# Patient Record
Sex: Female | Born: 1975 | Race: Black or African American | Hispanic: No | Smoking: Never smoker
Health system: Southern US, Community
[De-identification: ages and names within clinical notes are randomized; demographics above are authoritative.]

## PROBLEM LIST (undated history)

## (undated) DIAGNOSIS — D649 Anemia, unspecified: Secondary | ICD-10-CM

## (undated) DIAGNOSIS — E785 Hyperlipidemia, unspecified: Secondary | ICD-10-CM

## (undated) DIAGNOSIS — I10 Essential (primary) hypertension: Secondary | ICD-10-CM

## (undated) HISTORY — DX: Hyperlipidemia, unspecified: E78.5

## (undated) HISTORY — PX: OTHER SURGICAL HISTORY: SHX169

## (undated) HISTORY — PX: TUBAL LIGATION: SHX77

## (undated) HISTORY — DX: Anemia, unspecified: D64.9

## (undated) HISTORY — PX: LAPAROSCOPIC GASTRIC BANDING: SHX1100

## (undated) HISTORY — PX: BREAST SURGERY: SHX581

---

## 2007-11-08 ENCOUNTER — Ambulatory Visit (HOSPITAL_COMMUNITY): Admission: RE | Admit: 2007-11-08 | Discharge: 2007-11-08 | Payer: Self-pay | Admitting: Surgery

## 2007-11-15 ENCOUNTER — Encounter: Admission: RE | Admit: 2007-11-15 | Discharge: 2007-11-15 | Payer: Self-pay | Admitting: Surgery

## 2007-11-16 ENCOUNTER — Ambulatory Visit (HOSPITAL_COMMUNITY): Admission: RE | Admit: 2007-11-16 | Discharge: 2007-11-16 | Payer: Self-pay | Admitting: Surgery

## 2008-03-19 ENCOUNTER — Encounter: Admission: RE | Admit: 2008-03-19 | Discharge: 2008-05-07 | Payer: Self-pay | Admitting: Surgery

## 2008-04-08 ENCOUNTER — Ambulatory Visit (HOSPITAL_COMMUNITY): Admission: RE | Admit: 2008-04-08 | Discharge: 2008-04-09 | Payer: Self-pay | Admitting: Surgery

## 2010-09-06 ENCOUNTER — Encounter: Payer: Self-pay | Admitting: Surgery

## 2010-12-29 NOTE — Op Note (Signed)
[name], [name] NO.:  [id number]   MEDICAL RECORD NO.:  [id number]          PATIENT TYPE:  AMB   LOCATION:  DAY                          FACILITY:  [facility]   PHYSICIAN:  Sandria Bales. Ezzard Standing, M.D.  DATE OF BIRTH:  Nov 27, 1975   DATE OF PROCEDURE:  04/08/2008  DATE OF DISCHARGE:                               OPERATIVE REPORT   Date of Surgery ??   PREOPERATIVE DIAGNOSIS:  Morbid obesity with a weight of 269, body mass  index of 52.8.   POSTOPERATIVE DIAGNOSIS:  Morbid obesity with a weight of 269, body mass  index of 52.8.   PROCEDURE:  Lap band with an AP standard band.   SURGEON:  Dr. Ezzard Standing.   FIRST ASSISTANT:  Dr. Jaclynn Guarneri.   ANESTHESIA:  General endotracheal.   ESTIMATED BLOOD LOSS:  Minimal.   INDICATION FOR PROCEDURE:  Ms. Bishop is a 35 year old black female who  has been a patient of Dr. Nolon Nations who has been morbidly obese much  of her adult life.  She has been through our preop bariatric program and  now comes for attempted lap band.  The indications and potential  complications of band surgery explained to the patient.  Potential  complications include but not limited to bleeding, infection,  perforation of the bowel, erosion or slippage of the band and long-term  nutritional consequences.   Secondly, she was noted have a mildly elevated creatinine  preoperatively.  This was to 2.1.  This was checked a 2nd time.  Creatinine was 1.87.  I have talked about delaying proceeding with her  case, what the risks and benefits were, and she wanted to proceed with  her surgery.   OPERATIVE NOTE:  The patient placed in the supine position, given a  general endotracheal anesthetic.  Her abdomen was prepped with [facility].  She had a Foley catheter placed, and she was sterilely draped.   She was 1 g of Ancef at the initiation of the procedure.  A time-out was  held with identifying the patient and the procedure.   I accessed the abdominal  cavity through the left upper quadrant with an  11 mm OptiVu trocar.  I explored the abdomen, revealing right and left  lobes of liver unremarkable, gallbladder unremarkable, stomach  unremarkable.  The bowel that I could see was unremarkable.  There was  no other abnormality within the abdominal cavity.   I placed 4 additional trocars, a 5-mm subxiphoid trocar for the liver  retractor, a 15 mm right subcostal trocar, an 11 mm right paramedian  trocar, and an 11 mm left paramedian trocar.   The patient was placed in reverse Trendelenburg position.  A Nathanson  retractor was placed, retracted the left lobe of the liver.  I  identified the gastroesophageal junction and developed the plane along  the angle of his His on the left side.  I then went to the lesser sac  which we took to the gastrohepatic ligament towards the right crus and  passed the finger dissector behind the gastroesophageal junction.   Even though  her BMI is 52, in the upper abdomen she has a less fat than  expected, so I used an AP standard band which I placed into the  abdominal cavity and passed behind the gastroesophageal junction.  At  this time, I passed a sizer down, blew the balloon up to 15 mL and  pulled back, and she did have appear to have a hiatal weakness where the  balloon was drove about half way in but not all the way into the hiatus  on her preop upper GI; however, she did not have any evidence of a  hiatal hernia.  She was not been treated for reflux, so I did not try to  dissect out her hiatus.   I then pulled the balloon all the way around.  I cinched it down over  the sizing balloon.  I did not think the band was too tight.   I then removed the sizing balloon.  I imbricated the cardia, the stomach  over the band using 3 sutures of 2-0 Ethibond suture with the tie knot  cinching down the suture.   After the 3 sutures had been placed, the band lay in good position.  There was no bleeding.  Photos  were taken and placed in the chart.   I then took out the tubing out the right upper quadrant.  The trocars  were then removed.  In turn, there was no bleeding at the trocar site,  and the liver retractor had already been removed.   I then enlarged the incision in the right upper quadrant to about 3-3.5  cm.  I cut down to the abdominal wall fascia.  I attached the reservoir  to the Silastic tubing.  I then placed 2-0 Prolene sutures used for  these to tie the reservoir down.  The reservoir lay flat and appeared to  be in good position.   I then placed 2-0 Vicryl sutures under the subcutaneous tissue.  I  closed the skin with a 5-0 Monocryl suture at each site, and then the  wound was cleaned, prepped, covered with Dermabond.   The patient tolerated the procedure well, was transported to the  recovery room in good condition.  Sponge and needle count correct at the  end of the case.      Sandria Bales. Ezzard Standing, M.D.  Electronically Signed     DHN/MEDQ  D:  04/08/2008  T:  04/08/2008  Job:  [id number]   cc:   Nolon Nations MD

## 2011-10-14 ENCOUNTER — Other Ambulatory Visit (HOSPITAL_COMMUNITY)
Admission: RE | Admit: 2011-10-14 | Discharge: 2011-10-14 | Disposition: A | Discharge status: Home / Self Care | Payer: BC Managed Care – PPO | Source: Ambulatory Visit | Attending: Obstetrics and Gynecology | Admitting: Obstetrics and Gynecology

## 2011-10-14 DIAGNOSIS — Z124 Encounter for screening for malignant neoplasm of cervix: Secondary | ICD-10-CM | POA: Insufficient documentation

## 2011-10-19 ENCOUNTER — Telehealth (INDEPENDENT_AMBULATORY_CARE_PROVIDER_SITE_OTHER): Payer: Self-pay | Admitting: Surgery

## 2011-10-19 NOTE — Telephone Encounter (Signed)
10/19/11 mailed recall letter for bariatric surgery follow-up. Advised the patient to call [facility] @ [phone] to schedule an appointment...cef °

## 2011-11-22 ENCOUNTER — Encounter (HOSPITAL_COMMUNITY): Payer: Self-pay | Admitting: Pharmacist

## 2011-11-27 ENCOUNTER — Encounter (HOSPITAL_COMMUNITY): Payer: Self-pay

## 2011-11-29 ENCOUNTER — Encounter (HOSPITAL_COMMUNITY)
Admission: RE | Admit: 2011-11-29 | Discharge: 2011-11-29 | Disposition: A | Discharge status: Home / Self Care | Payer: BC Managed Care – PPO | Source: Ambulatory Visit | Attending: Obstetrics and Gynecology | Admitting: Obstetrics and Gynecology

## 2011-11-29 ENCOUNTER — Encounter (HOSPITAL_COMMUNITY): Payer: Self-pay

## 2011-11-29 HISTORY — DX: Essential (primary) hypertension: I10

## 2011-11-29 NOTE — Patient Instructions (Signed)
YOUR PROCEDURE IS SCHEDULED ON:12/06/11  ENTER THROUGH THE MAIN ENTRANCE OF [facility] AT:1030  USE DESK PHONE AND DIAL [phone] TO INFORM us OF YOUR ARRIVAL  CALL [phone] IF YOU HAVE ANY QUESTIONS OR PROBLEMS PRIOR TO YOUR ARRIVAL.  REMEMBER: DO NOT AFTER MIDNIGHT : Sunday  SPECIAL INSTRUCTIONS:clear liquids of until 8am Monday   YOU MAY BRUSH YOUR TEETH THE MORNING OF SURGERY   TAKE THESE MEDICINES THE DAY OF SURGERY WITH SIP OF WATER: BP med  DO NOT WEAR JEWELRY, EYE MAKEUP, LIPSTICK OR DARK FINGERNAIL POLISH DO NOT WEAR LOTIONS DO NOT SHAVE FOR 48 HOURS PRIOR TO SURGERY  YOU WILL NOT BE ALLOWED TO DRIVE YOURSELF HOME.  NAME OF DRIVER:APril sister- [phone]

## 2011-11-30 LAB — CBC
Hemoglobin: 11.1 g/dL — ABNORMAL LOW (ref 12.0–15.0)
MCHC: 31.8 g/dL (ref 30.0–36.0)
Platelets: 431 10*3/uL — ABNORMAL HIGH (ref 150–400)
RDW: 16.5 % — ABNORMAL HIGH (ref 11.5–15.5)

## 2011-12-02 NOTE — Pre-Procedure Instructions (Signed)
Message left on answering machine of time change to 9AM, to arrive here at 7:30. Number given if questions.[phone]).  Patton Salles, RN

## 2011-12-06 ENCOUNTER — Encounter (HOSPITAL_COMMUNITY)
Admission: RE | Disposition: A | Discharge status: Home / Self Care | Payer: Self-pay | Source: Ambulatory Visit | Attending: Obstetrics and Gynecology

## 2011-12-06 ENCOUNTER — Ambulatory Visit (HOSPITAL_COMMUNITY): Payer: BC Managed Care – PPO | Admitting: Anesthesiology

## 2011-12-06 ENCOUNTER — Encounter (HOSPITAL_COMMUNITY): Payer: Self-pay | Admitting: Anesthesiology

## 2011-12-06 ENCOUNTER — Ambulatory Visit (HOSPITAL_COMMUNITY)
Admission: RE | Admit: 2011-12-06 | Discharge: 2011-12-06 | Disposition: A | Discharge status: Home / Self Care | Payer: BC Managed Care – PPO | Source: Ambulatory Visit | Attending: Obstetrics and Gynecology | Admitting: Obstetrics and Gynecology

## 2011-12-06 ENCOUNTER — Other Ambulatory Visit (HOSPITAL_COMMUNITY): Payer: Self-pay | Admitting: Obstetrics and Gynecology

## 2011-12-06 DIAGNOSIS — Z01818 Encounter for other preprocedural examination: Secondary | ICD-10-CM | POA: Insufficient documentation

## 2011-12-06 DIAGNOSIS — N840 Polyp of corpus uteri: Secondary | ICD-10-CM | POA: Insufficient documentation

## 2011-12-06 DIAGNOSIS — Z01812 Encounter for preprocedural laboratory examination: Secondary | ICD-10-CM | POA: Insufficient documentation

## 2011-12-06 DIAGNOSIS — Z9889 Other specified postprocedural states: Secondary | ICD-10-CM

## 2011-12-06 DIAGNOSIS — N920 Excessive and frequent menstruation with regular cycle: Secondary | ICD-10-CM | POA: Insufficient documentation

## 2011-12-06 LAB — BASIC METABOLIC PANEL
GFR calc Af Amer: >90 mL/min (ref >90)
GFR calc non Af Amer: >90 mL/min (ref >90)
Potassium: 3.4 mEq/L — ABNORMAL LOW (ref 3.5–5.1)
Sodium: 137 mEq/L (ref 135–145)

## 2011-12-06 SURGERY — DILATATION & CURETTAGE/HYSTEROSCOPY WITH THERMACHOICE ABLATION
Anesthesia: General | Site: Vagina | Wound class: Clean Contaminated

## 2011-12-06 MED ORDER — PROPOFOL 10 MG/ML IV EMUL
INTRAVENOUS | Status: DC | PRN
  Administered 2011-12-06: 200 mg via INTRAVENOUS

## 2011-12-06 MED ORDER — KETOROLAC TROMETHAMINE 30 MG/ML IJ SOLN
INTRAMUSCULAR | Status: DC | PRN
  Administered 2011-12-06: 30 mg via INTRAVENOUS

## 2011-12-06 MED ORDER — PROPOFOL 10 MG/ML IV EMUL
INTRAVENOUS | Status: AC
  Filled 2011-12-06: qty 20

## 2011-12-06 MED ORDER — OXYCODONE-ACETAMINOPHEN 5-325 MG PO TABS
1.0000 | ORAL_TABLET | Freq: Once | ORAL | Status: AC
  Administered 2011-12-06: 1 via ORAL

## 2011-12-06 MED ORDER — OXYCODONE-ACETAMINOPHEN 5-325 MG PO TABS
ORAL_TABLET | ORAL | Status: AC
  Filled 2011-12-06: qty 1

## 2011-12-06 MED ORDER — FENTANYL CITRATE 0.05 MG/ML IJ SOLN
INTRAMUSCULAR | Status: DC | PRN
  Administered 2011-12-06: 100 ug via INTRAVENOUS

## 2011-12-06 MED ORDER — ONDANSETRON HCL 4 MG/2ML IJ SOLN
INTRAMUSCULAR | Status: AC
  Filled 2011-12-06: qty 2

## 2011-12-06 MED ORDER — DEXTROSE 5 % IV SOLN
INTRAVENOUS | Status: DC | PRN
  Administered 2011-12-06: 1000 mL via INTRAVENOUS

## 2011-12-06 MED ORDER — FENTANYL CITRATE 0.05 MG/ML IJ SOLN
INTRAMUSCULAR | Status: AC
  Filled 2011-12-06: qty 2

## 2011-12-06 MED ORDER — METOCLOPRAMIDE HCL 5 MG/ML IJ SOLN: 10.0000 mg | Freq: Once | INTRAMUSCULAR | Status: DC | PRN

## 2011-12-06 MED ORDER — FENTANYL CITRATE 0.05 MG/ML IJ SOLN
25.0000 ug | INTRAMUSCULAR | Status: DC | PRN
  Administered 2011-12-06: 50 ug via INTRAVENOUS
  Administered 2011-12-06: 25 ug via INTRAVENOUS
  Administered 2011-12-06: 50 ug via INTRAVENOUS
  Administered 2011-12-06: 25 ug via INTRAVENOUS

## 2011-12-06 MED ORDER — KETOROLAC TROMETHAMINE 30 MG/ML IJ SOLN
INTRAMUSCULAR | Status: AC
  Filled 2011-12-06: qty 1

## 2011-12-06 MED ORDER — PROMETHAZINE HCL 25 MG RE SUPP
RECTAL | Status: AC
  Administered 2011-12-06: 25 mg via RECTAL
  Filled 2011-12-06: qty 1

## 2011-12-06 MED ORDER — FENTANYL CITRATE 0.05 MG/ML IJ SOLN
INTRAMUSCULAR | Status: AC
Start: 2011-12-06 — End: 2011-12-06
  Administered 2011-12-06: 50 ug via INTRAVENOUS
  Filled 2011-12-06: qty 2

## 2011-12-06 MED ORDER — DEXAMETHASONE SODIUM PHOSPHATE 10 MG/ML IJ SOLN
INTRAMUSCULAR | Status: AC
  Filled 2011-12-06: qty 1

## 2011-12-06 MED ORDER — DEXAMETHASONE SODIUM PHOSPHATE 10 MG/ML IJ SOLN
INTRAMUSCULAR | Status: DC | PRN
  Administered 2011-12-06: 10 mg via INTRAVENOUS

## 2011-12-06 MED ORDER — MIDAZOLAM HCL 5 MG/5ML IJ SOLN
INTRAMUSCULAR | Status: DC | PRN
  Administered 2011-12-06: 2 mg via INTRAVENOUS

## 2011-12-06 MED ORDER — LIDOCAINE HCL (CARDIAC) 20 MG/ML IV SOLN
INTRAVENOUS | Status: DC | PRN
  Administered 2011-12-06: 80 mg via INTRAVENOUS

## 2011-12-06 MED ORDER — LACTATED RINGERS IV SOLN
INTRAVENOUS | Status: DC
  Administered 2011-12-06 (×2): via INTRAVENOUS

## 2011-12-06 MED ORDER — PROMETHAZINE HCL 25 MG RE SUPP
25.0000 mg | Freq: Once | RECTAL | Status: AC
  Administered 2011-12-06: 25 mg via RECTAL

## 2011-12-06 MED ORDER — LIDOCAINE HCL (CARDIAC) 20 MG/ML IV SOLN
INTRAVENOUS | Status: AC
  Filled 2011-12-06: qty 5

## 2011-12-06 MED ORDER — 0.9 % SODIUM CHLORIDE (POUR BTL) OPTIME
TOPICAL | Status: DC | PRN
  Administered 2011-12-06: 1000 mL

## 2011-12-06 MED ORDER — MIDAZOLAM HCL 2 MG/2ML IJ SOLN
INTRAMUSCULAR | Status: AC
  Filled 2011-12-06: qty 2

## 2011-12-06 MED ORDER — ONDANSETRON HCL 4 MG/2ML IJ SOLN
INTRAMUSCULAR | Status: DC | PRN
  Administered 2011-12-06: 4 mg via INTRAVENOUS

## 2011-12-06 MED ORDER — MEPERIDINE HCL 25 MG/ML IJ SOLN: 6.2500 mg | INTRAMUSCULAR | Status: DC | PRN

## 2011-12-06 SURGICAL SUPPLY — 15 items
CANISTER SUCTION 2500CC (MISCELLANEOUS) ×2 IMPLANT
CATH ROBINSON RED A/P 16FR (CATHETERS) ×2 IMPLANT
CATH THERMACHOICE III (CATHETERS) ×2 IMPLANT
CLOTH BEACON ORANGE TIMEOUT ST (SAFETY) ×2 IMPLANT
CONTAINER PREFILL 10% NBF 60ML (FORM) ×4 IMPLANT
DILATOR CANAL MILEX (MISCELLANEOUS) IMPLANT
GLOVE BIO SURGEON STRL SZ7 (GLOVE) ×2 IMPLANT
GLOVE BIOGEL PI IND STRL 7.0 (GLOVE) ×2 IMPLANT
GLOVE BIOGEL PI INDICATOR 7.0 (GLOVE) ×2
GOWN PREVENTION PLUS LG XLONG (DISPOSABLE) ×2 IMPLANT
GOWN PREVENTION PLUS XLARGE (GOWN DISPOSABLE) ×2 IMPLANT
GOWN STRL REIN XL XLG (GOWN DISPOSABLE) ×2 IMPLANT
PACK HYSTEROSCOPY LF (CUSTOM PROCEDURE TRAY) ×2 IMPLANT
TOWEL OR 17X24 6PK STRL BLUE (TOWEL DISPOSABLE) ×4 IMPLANT
WATER STERILE IRR 1000ML POUR (IV SOLUTION) ×2 IMPLANT

## 2011-12-06 NOTE — Anesthesia Preprocedure Evaluation (Signed)
Anesthesia Evaluation  Patient identified by MRN, date of birth, ID band Patient awake    Reviewed: Allergy & Precautions, H&P , NPO status , Patient's Chart, lab work & pertinent test results  Airway Mallampati: II TM Distance: >3 FB Neck ROM: Full    Dental No notable dental hx. (+) Teeth Intact   Pulmonary neg pulmonary ROS,  breath sounds clear to auscultation  Pulmonary exam normal       Cardiovascular hypertension, Pt. on medications Rhythm:Regular Rate:Normal     Neuro/Psych negative neurological ROS  negative psych ROS   GI/Hepatic negative GI ROS, Neg liver ROS,   Endo/Other  Morbid obesity  Renal/GU negative Renal ROS  negative genitourinary   Musculoskeletal negative musculoskeletal ROS (+)   Abdominal Normal abdominal exam  (+) + obese,   Peds  Hematology negative hematology ROS (+)   Anesthesia Other Findings   Reproductive/Obstetrics negative OB ROS                           Anesthesia Physical Anesthesia Plan  ASA: III  Anesthesia Plan: General   Post-op Pain Management:    Induction: Intravenous  Airway Management Planned: LMA  Additional Equipment:   Intra-op Plan:   Post-operative Plan: Extubation in OR  Informed Consent: I have reviewed the patients History and Physical, chart, labs and discussed the procedure including the risks, benefits and alternatives for the proposed anesthesia with the patient or authorized representative who has indicated his/her understanding and acceptance.   Dental advisory given  Plan Discussed with: CRNA, Anesthesiologist and Surgeon  Anesthesia Plan Comments:         Anesthesia Quick Evaluation

## 2011-12-06 NOTE — Discharge Instructions (Signed)

## 2011-12-06 NOTE — Transfer of Care (Signed)
Immediate Anesthesia Transfer of Care Note  Patient: [name]  Procedure(s) Performed: Procedure(s) (LRB): DILATATION & CURETTAGE/HYSTEROSCOPY WITH THERMACHOICE ABLATION (N/A)  Patient Location: PACU  Anesthesia Type: General  Level of Consciousness: sedated  Airway & Oxygen Therapy: Patient Spontanous Breathing and Patient connected to nasal cannula oxygen  Post-op Assessment: Report given to PACU RN and Post -op Vital signs reviewed and stable  Post vital signs: stable  Complications: No apparent anesthesia complications

## 2011-12-06 NOTE — Anesthesia Postprocedure Evaluation (Signed)
  Anesthesia Post-op Note  Patient: [name]  Procedure(s) Performed: Procedure(s) (LRB): DILATATION & CURETTAGE/HYSTEROSCOPY WITH THERMACHOICE ABLATION (N/A)  Patient Location: PACU  Anesthesia Type: General  Level of Consciousness: awake, alert  and oriented  Airway and Oxygen Therapy: Patient Spontanous Breathing  Post-op Pain: none  Post-op Assessment: Post-op Vital signs reviewed, Patient's Cardiovascular Status Stable, Respiratory Function Stable, Patent Airway, No signs of Nausea or vomiting and Pain level controlled  Post-op Vital Signs: Reviewed and stable  Complications: No apparent anesthesia complications

## 2011-12-06 NOTE — H&P (Signed)
The patient is a 36 yo G3 P3 s/p tubal with menorrhagia.  Ablation was attempted in the office 3 weeks ago and stopped due to discomfort.  The procedure in now scheduled in out patient surgery.risks and benefits of the procedure, as well as possible complications have been explained.  Past medical history is noncontributory.  No known allergy  Physical exam Vital signs are stable HEENT exam is Chest is clear Heart S1 and S2 is clear Abdomen normal.   Pelvic exam is deferred today  Assessment menorrhagia Plan ThermaChoice ablation

## 2011-12-07 NOTE — Op Note (Signed)
12/06/2011  Procedure time 9:30 AM    PATIENT:  [name]  36 y.o. female gravida 6, para 3, A3, status post tubal ligation, who worsening menorrhagia, RN, Mindi Junker a the patient underwent a hysteroscopy, D&C, and attempted ThermaChoice ablation in the office.  This had to be discontinued due to severe cramping and discomfort.  And we are rescheduling the procedure today in outpatient clinic or we can give no anesthesia. The risks, and possible complications of the procedure has been explained and informed consent   PRE-OPERATIVE DIAGNOSIS: menorrhagia  POST-OPERATIVE DIAGNOSIS: same  PROCEDURE: ThermaChoice ablation and polypectomy   SURGEON: Dr. Arlyce Harman    PHYSICIAN ASSISTANT: none  ASSISTANTS: none   ANESTHESIA:  Gen.  EBL: minimal  BLOOD ADMINISTERED:  None  DRAINS: 9   LOCAL MEDICATIONS USED: none  SPECIMEN: Uterine polyp  DISPOSITION OF SPECIMEN:  Sent to pathology  COUNTS:  correct   DICTATION:  Detail of procedure  The patient was placed on the table in a supine position.  General anesthesia was induced.  She was then placed in a dorsal lithotomy position.  The vagina was sterilely prepped and draped in usual fashion.  A red Robinson catheter was used to drain the bladder.  An exam under anesthesia revealed an approximately 8 week size uterus that was  anteflexed.  The uterus sounded to 8 cm.  The procedure was initiated by placing 15 cc of D5W into the balloon to check for compliance.  There were no leaks.  Then, the fluid was aspirated out as the trumpet valve was tapped until a pressure reached -150.  The syringe was filled up witho 30 cc of D5W.  The syringe was reattached to the catheter and the catheter was placed inside the uterus.  And and the balloon was slowly filled until we got a pressure of 180, carefully tapping off as needed to keep the pressure under 200.  After the pressure had stabilized, we pressed to start. The temperature rose up to 87,  and then the machine started the therapy cycle.  After 8 minutes the therapy cycle was completed. The cool down was allowed and the machine beeped signifying its completion.  We aspirated the fluid out of the balloon and removed the catheter.  At this poin, all instruments were removed and the patient was awakened and taken to recovery in good condition.    PLAN OF CARE: discharge home  PATIENT DISPOSITION: patient sent to recovery in good condition

## 2011-12-07 NOTE — H&P (Signed)
         Patient Information       Patient Name  Sex  DOB  SSN    Saraiya, Kozma  Female  May 12, 1976  [id number]             H&P signed by Fortino Sic, MD at 12/06/11 [id number]     Author:  Fortino Sic, MD  Service:  Gynecology  Author Type:  Physician   Filed:  12/06/11 0913  Note Time:  12/06/11 0907          The patient is a 36 yo G6 P3A3 s/p tubal with menorrhagia. Ablation was attempted in the office approximately six  weeks ago and stopped due to discomfort. The procedure in now scheduled in out patient surgery. Risks and benefits of the procedure, as well as possible complications have been explained. Informed consent has been given.  Past medical history Hypertension Hypercholesterolemia   No known allergy   Medication Lipitor, hydrochlorothiazide  Family history diabetes in maternal grandmother and on an uncle Hypertension.  Grandmother and an uncle  Review of systems is noncontributory   Physical exam  Vital signs are stable  HEENT exam is  Chest is clear  Heart S1 and S2 is clear  Abdomen normal, without masses or tenderness  Pelvic exam is deferred today    Assessment:   menorrhagia   Plan:   ThermaChoice ablation

## 2012-10-26 ENCOUNTER — Ambulatory Visit (INDEPENDENT_AMBULATORY_CARE_PROVIDER_SITE_OTHER): Payer: BC Managed Care – PPO | Admitting: Emergency Medicine

## 2012-10-26 VITALS — BP 122/70 | HR 58 | Temp 98.5°F | Resp 16 | Ht 60.5 in | Wt 223.0 lb

## 2012-10-26 DIAGNOSIS — E782 Mixed hyperlipidemia: Secondary | ICD-10-CM

## 2012-10-26 MED ORDER — HYDROCHLOROTHIAZIDE 12.5 MG PO CAPS: 12.5000 mg | ORAL_CAPSULE | Freq: Every day | ORAL | Status: DC

## 2012-10-26 MED ORDER — ATORVASTATIN CALCIUM 40 MG PO TABS: 40.0000 mg | ORAL_TABLET | Freq: Every day | ORAL | Status: DC

## 2012-10-26 NOTE — Progress Notes (Signed)
[facility] [address] [place] [address] [phone]  Date:  10/26/2012   Name:  [name]   DOB:  09/06/1975   MRN:  [id number]  PCP:  Garth Schlatter, MD    Chief Complaint: Edema and bloodwork   History of Present Illness:  [name] is a 37 y.o. very pleasant female patient who presents with the following:  History of peripheral edema treated in the past with HCTZ.  Has been off the medication 2 weeks and requests a refill.  Also is out of her cholesterol medication.  No shortness of breath, wheezing, cough or chest pain.  Denies other complaint or health concern today.   There is no problem list on file for this patient.   Past Medical History  Diagnosis Date  . Hypertension   . Hyperlipidemia   . Anemia     Past Surgical History  Procedure Laterality Date  . Tubal ligation    . Breast surgery    . Laparoscopic gastric banding    . Oblation      History  Substance Use Topics  . Smoking status: Never Smoker   . Smokeless tobacco: Not on file  . Alcohol Use: No     Comment: once in a blue moon    Family History  Problem Relation Age of Onset  . Hypertension Maternal Grandmother   . Diabetes Maternal Grandmother   . Diabetes Maternal Grandfather   . Hypertension Maternal Grandfather     No Known Allergies  Medication list has been reviewed and updated.  Current Outpatient Prescriptions on File Prior to Visit  Medication Sig Dispense Refill  . atorvastatin (LIPITOR) 40 MG tablet Take 40 mg by mouth daily.      . hydrochlorothiazide (MICROZIDE) 12.5 MG capsule Take 12.5 mg by mouth daily.      Marland Kitchen ibuprofen (ADVIL,MOTRIN) 800 MG tablet Take 800 mg by mouth daily as needed. pain      . oxyCODONE-acetaminophen (PERCOCET) 5-325 MG per tablet Take 1 tablet by mouth every 4 (four) hours as needed. For pain (after procedure)       No current facility-administered medications on file prior to visit.    Review of  Systems:  As per HPI, otherwise negative.    Physical Examination: Filed Vitals:   10/26/12 2043  BP: 122/70  Pulse: 58  Temp: 98.5 F (36.9 C)  Resp: 16   Filed Vitals:   10/26/12 2043  Height: 5' 0.5" (1.537 m)  Weight: 223 lb (101.152 kg)   Body mass index is 42.82 kg/(m^2). Ideal Body Weight: Weight in (lb) to have BMI = 25: 129.9  GEN: WDWN, NAD, Non-toxic, A & O x 3 HEENT: Atraumatic, Normocephalic. Neck supple. No masses, No LAD. Ears and Nose: No external deformity. CV: RRR, No M/G/R. No JVD. No thrill. No extra heart sounds. PULM: CTA B, no wheezes, crackles, rhonchi. No retractions. No resp. distress. No accessory muscle use. ABD: S, NT, ND, +BS. No rebound. No HSM. EXTR: No c/c  Trace peripheral edema ankles NEURO Normal gait.  PSYCH: Normally interactive. Conversant. Not depressed or anxious appearing.  Calm demeanor.    Assessment and Plan: Peripheral edema Hyperlipidemia Refill meds Follow up in one month  Carmelina Dane, MD

## 2012-10-26 NOTE — Addendum Note (Signed)
Addended by: Carmelina Dane on: 10/26/2012 09:12 PM   Modules accepted: Orders

## 2012-10-26 NOTE — Patient Instructions (Addendum)

## 2012-11-02 ENCOUNTER — Ambulatory Visit
Admission: RE | Admit: 2012-11-02 | Discharge: 2012-11-02 | Disposition: A | Discharge status: Home / Self Care | Payer: BC Managed Care – PPO | Source: Ambulatory Visit | Attending: Physician Assistant | Admitting: Physician Assistant

## 2012-11-02 ENCOUNTER — Encounter (INDEPENDENT_AMBULATORY_CARE_PROVIDER_SITE_OTHER): Payer: Self-pay

## 2012-11-02 ENCOUNTER — Ambulatory Visit (INDEPENDENT_AMBULATORY_CARE_PROVIDER_SITE_OTHER): Payer: BC Managed Care – PPO | Admitting: Physician Assistant

## 2012-11-02 VITALS — BP 118/68 | HR 70 | Temp 96.8°F | Resp 18 | Ht 59.0 in | Wt 214.6 lb

## 2012-11-02 DIAGNOSIS — Z4651 Encounter for fitting and adjustment of gastric lap band: Secondary | ICD-10-CM

## 2012-11-02 NOTE — Patient Instructions (Signed)
Return in two weeks. Focus on good food choices as well as physical activity. Return sooner if you have an increase in hunger, portion sizes or weight. Return also for difficulty swallowing, night cough, reflux.   

## 2012-11-02 NOTE — Progress Notes (Signed)
  HISTORY: [name] is a 37 y.o.female who received an AP-Standard lap-band in August 2009 by Dr. Ezzard Standing. She comes in with 43 lbs weight loss since her last visit in January 2012 but unfortunately she's had three months of progressive dysphagia, to the point where she cannot tolerate water well. She would like fluid removed.  VITAL SIGNS: Filed Vitals:   11/02/12 1058  BP: 118/68  Pulse: 70  Temp: 96.8 F (36 C)  Resp: 18    PHYSICAL EXAM: Physical exam reveals a very well-appearing 37 y.o.female in no apparent distress Neurologic: Awake, alert, oriented Psych: Bright affect, conversant Respiratory: Breathing even and unlabored. No stridor or wheezing Abdomen: Soft, nontender, nondistended to palpation. Incisions well-healed. No incisional hernias. Port easily palpated. Extremities: Atraumatic, good range of motion.  ASSESMENT: 37 y.o.  female  s/p AP-Standard lap-band.   PLAN: The patient's port was accessed with a 20G Huber needle without difficulty. Clear fluid was aspirated and 6.5 mL saline was removed from the port to give a total predicted volume of 0 mL. The patient was able to swallow water without difficulty and with significantly greater ease following the procedure and was instructed to take clear liquids for the next 24-48 hours and advance slowly as tolerated. I've ordered a KUB to rule out slip. We'll have her back in two weeks.

## 2012-11-06 ENCOUNTER — Telehealth (INDEPENDENT_AMBULATORY_CARE_PROVIDER_SITE_OTHER): Payer: Self-pay | Admitting: *Deleted

## 2012-11-06 NOTE — Telephone Encounter (Signed)
Patient called to ask about XR results and to state she is having acid reflux.  Patient instructed to use an OTC medication Zantac or Prilosec to help with the GERD.  Andrey Campanile MD reviewed XR and stated Ezzard Standing MD may want to see patient for follow-up but didn't see anything urgent.  Patient has an appt with Mardelle Matte PA on 11/16/12

## 2012-11-07 ENCOUNTER — Telehealth (INDEPENDENT_AMBULATORY_CARE_PROVIDER_SITE_OTHER): Payer: Self-pay

## 2012-11-07 NOTE — Telephone Encounter (Signed)
Patient states the Tums has relieved her reflux I advised her not to eat 2hrs prior to going to bed. Avoid spicy , greasy,foods. Patient verbalized understanding .

## 2012-11-16 ENCOUNTER — Ambulatory Visit (INDEPENDENT_AMBULATORY_CARE_PROVIDER_SITE_OTHER): Payer: BC Managed Care – PPO | Admitting: Physician Assistant

## 2012-11-16 ENCOUNTER — Encounter (INDEPENDENT_AMBULATORY_CARE_PROVIDER_SITE_OTHER): Payer: Self-pay

## 2012-11-16 VITALS — BP 136/76 | HR 86 | Temp 97.6°F | Resp 18 | Ht 60.0 in | Wt 219.0 lb

## 2012-11-16 DIAGNOSIS — Z4651 Encounter for fitting and adjustment of gastric lap band: Secondary | ICD-10-CM

## 2012-11-16 NOTE — Progress Notes (Signed)
  HISTORY: [name] is a 37 y.o.female who received an AP-Standard lap-band in August 2009 by Dr. Ezzard Standing. She comes in after having all fluid removed for obstruction on 11/02/12. She had a couple of days of epigastric burning sensation following fluid removal, but this has completely resolved. She is now experiencing hunger and large portion sizes as well as 4 lbs weight gain. She would like a fill. She is going on a cruise toward the end of the month.  VITAL SIGNS: Filed Vitals:   11/16/12 1136  BP: 136/76  Pulse: 86  Temp: 97.6 F (36.4 C)  Resp: 18    PHYSICAL EXAM: Physical exam reveals a very well-appearing 37 y.o.female in no apparent distress Neurologic: Awake, alert, oriented Psych: Bright affect, conversant Respiratory: Breathing even and unlabored. No stridor or wheezing Abdomen: Soft, nontender, nondistended to palpation. Incisions well-healed. No incisional hernias. Port easily palpated. Extremities: Atraumatic, good range of motion.  ASSESMENT: 37 y.o.  female  s/p AP-Standard lap-band.   PLAN: The patient's port was accessed with a 20G Huber needle without difficulty. Clear fluid was aspirated and 4.5 mL saline was added to the port to give a total predicted volume of 4.5 mL. The patient was able to swallow water without difficulty following the procedure and was instructed to take clear liquids for the next 24-48 hours and advance slowly as tolerated. Since [place] is going out of the country, I wanted to minimize the risk of obstruction while gone. I'd imagine her target fill volume between 5.5-6 mL. I discussed this with the patient who voiced understanding and agreement.

## 2012-11-16 NOTE — Patient Instructions (Signed)
Take clear liquids tonight. Thin protein shakes are ok to start tomorrow morning. Slowly advance your diet thereafter. Call us if you have persistent vomiting or regurgitation, night cough or reflux symptoms. Return as scheduled or sooner if you notice no changes in hunger/portion sizes.  

## 2012-12-28 ENCOUNTER — Ambulatory Visit (INDEPENDENT_AMBULATORY_CARE_PROVIDER_SITE_OTHER): Payer: BC Managed Care – PPO | Admitting: Physician Assistant

## 2012-12-28 ENCOUNTER — Encounter (INDEPENDENT_AMBULATORY_CARE_PROVIDER_SITE_OTHER): Payer: Self-pay

## 2012-12-28 VITALS — BP 120/82 | HR 76 | Temp 97.0°F | Resp 18 | Ht 59.0 in | Wt 230.8 lb

## 2012-12-28 DIAGNOSIS — Z4651 Encounter for fitting and adjustment of gastric lap band: Secondary | ICD-10-CM

## 2012-12-28 NOTE — Patient Instructions (Signed)
Take clear liquids tonight. Thin protein shakes are ok to start tomorrow morning. Slowly advance your diet thereafter. Call us if you have persistent vomiting or regurgitation, night cough or reflux symptoms. Return as scheduled or sooner if you notice no changes in hunger/portion sizes.  

## 2012-12-28 NOTE — Progress Notes (Signed)
  HISTORY: [name] is a 37 y.o.female who received an AP-Standard lap-band in August 2009 by Dr. Ezzard Standing. She comes in with 12 lbs weight gain since going on a cruise. She has no complaints of regurgitation or reflux. We had replace 4.5 mL fluid in her band in April from a previous lap band holiday secondary to obstruction. She has significant hunger and larger than desired portion sizes.  VITAL SIGNS: Filed Vitals:   12/28/12 1015  BP: 120/82  Pulse: 76  Temp: 97 F (36.1 C)  Resp: 18    PHYSICAL EXAM: Physical exam reveals a very well-appearing 37 y.o.female in no apparent distress Neurologic: Awake, alert, oriented Psych: Bright affect, conversant Respiratory: Breathing even and unlabored. No stridor or wheezing Abdomen: Soft, nontender, nondistended to palpation. Incisions well-healed. No incisional hernias. Port easily palpated. Extremities: Atraumatic, good range of motion.  ASSESMENT: 37 y.o.  female  s/p AP-Standard lap-band.   PLAN: The patient's port was accessed with a 20G Huber needle without difficulty. Clear fluid was aspirated and 1 mL saline was added to the port to give a total predicted volume of 5.5 mL. The patient was able to swallow water without difficulty following the procedure and was instructed to take clear liquids for the next 24-48 hours and advance slowly as tolerated.

## 2012-12-29 ENCOUNTER — Other Ambulatory Visit (INDEPENDENT_AMBULATORY_CARE_PROVIDER_SITE_OTHER): Payer: BC Managed Care – PPO | Admitting: Emergency Medicine

## 2012-12-29 VITALS — HR 72 | Temp 98.0°F | Resp 18

## 2012-12-29 DIAGNOSIS — E782 Mixed hyperlipidemia: Secondary | ICD-10-CM

## 2012-12-29 DIAGNOSIS — I10 Essential (primary) hypertension: Secondary | ICD-10-CM

## 2012-12-29 LAB — COMPREHENSIVE METABOLIC PANEL
ALT: 17 U/L (ref 0–35)
CO2: 26 mEq/L (ref 19–32)
Calcium: 9.3 mg/dL (ref 8.4–10.5)
Chloride: 103 mEq/L (ref 96–112)
Creat: 0.84 mg/dL (ref 0.50–1.10)
Glucose, Bld: 88 mg/dL (ref 70–99)

## 2012-12-29 LAB — LIPID PANEL
Cholesterol: 157 mg/dL (ref 0–200)
Total CHOL/HDL Ratio: 3 Ratio

## 2012-12-30 LAB — VITAMIN D 25 HYDROXY (VIT D DEFICIENCY, FRACTURES): Vit D, 25-Hydroxy: 19 ng/mL — ABNORMAL LOW (ref 30–89)

## 2012-12-30 MED ORDER — VITAMIN D (ERGOCALCIFEROL) 1.25 MG (50000 UNIT) PO CAPS: 50000.0000 [IU] | ORAL_CAPSULE | ORAL | Status: DC

## 2012-12-30 NOTE — Addendum Note (Signed)
Addended by: Carmelina Dane on: 12/30/2012 12:45 PM   Modules accepted: Orders

## 2013-01-01 ENCOUNTER — Other Ambulatory Visit: Payer: Self-pay | Admitting: Emergency Medicine

## 2013-01-01 NOTE — Telephone Encounter (Signed)
Due for OV, labs 

## 2013-01-10 ENCOUNTER — Telehealth (HOSPITAL_COMMUNITY): Payer: Self-pay | Admitting: *Deleted

## 2013-01-10 NOTE — Telephone Encounter (Signed)
Telephoned pt at mobile # and left message to return call to [facility]

## 2013-01-29 ENCOUNTER — Other Ambulatory Visit: Payer: Self-pay | Admitting: Physician Assistant

## 2013-01-30 NOTE — Telephone Encounter (Signed)
Was due for f/u April 2014

## 2013-02-01 ENCOUNTER — Encounter (INDEPENDENT_AMBULATORY_CARE_PROVIDER_SITE_OTHER): Payer: BC Managed Care – PPO

## 2013-03-05 ENCOUNTER — Other Ambulatory Visit: Payer: Self-pay | Admitting: Physician Assistant

## 2013-03-08 ENCOUNTER — Other Ambulatory Visit: Payer: Self-pay | Admitting: Physician Assistant

## 2013-03-15 ENCOUNTER — Encounter (INDEPENDENT_AMBULATORY_CARE_PROVIDER_SITE_OTHER): Payer: BC Managed Care – PPO

## 2013-04-06 ENCOUNTER — Other Ambulatory Visit: Payer: Self-pay | Admitting: Physician Assistant

## 2013-04-06 NOTE — Telephone Encounter (Signed)
Needs OV, labs 

## 2013-04-10 ENCOUNTER — Telehealth: Payer: Self-pay

## 2013-04-10 NOTE — Telephone Encounter (Signed)
done

## 2013-04-10 NOTE — Telephone Encounter (Signed)
Patient is requesting a medical release form be faxed to her at [phone]

## 2013-04-12 ENCOUNTER — Telehealth (INDEPENDENT_AMBULATORY_CARE_PROVIDER_SITE_OTHER): Payer: Self-pay

## 2013-04-12 ENCOUNTER — Encounter (INDEPENDENT_AMBULATORY_CARE_PROVIDER_SITE_OTHER): Payer: BC Managed Care – PPO

## 2013-04-12 NOTE — Telephone Encounter (Signed)
Left a message for the patient to give [place] a call to reschedule her missed appointment.

## 2013-04-25 ENCOUNTER — Encounter (INDEPENDENT_AMBULATORY_CARE_PROVIDER_SITE_OTHER): Payer: Self-pay | Admitting: Physician Assistant

## 2013-06-14 ENCOUNTER — Other Ambulatory Visit: Payer: Self-pay | Admitting: Physician Assistant

## 2013-06-21 ENCOUNTER — Encounter (INDEPENDENT_AMBULATORY_CARE_PROVIDER_SITE_OTHER): Payer: Self-pay

## 2013-06-21 ENCOUNTER — Ambulatory Visit (INDEPENDENT_AMBULATORY_CARE_PROVIDER_SITE_OTHER): Payer: BC Managed Care – PPO | Admitting: Physician Assistant

## 2013-06-21 VITALS — BP 124/84 | HR 84 | Temp 98.6°F | Resp 15 | Ht 60.0 in | Wt 268.0 lb

## 2013-06-21 DIAGNOSIS — Z4651 Encounter for fitting and adjustment of gastric lap band: Secondary | ICD-10-CM

## 2013-06-21 NOTE — Progress Notes (Signed)
  HISTORY: [name] is a 37 y.o.female who received an AP-Standard lap-band in August 2009 by Dr. Ezzard Standing. She comes in with 37 lbs weight gain since her last visit in May. She is basically back up to her pre-op weight. She denies regurgitation but she basically can eat anything. She was unable to come to clinic to get fills in the meantime. She has not had much exercise recently due to the change in weather. She is now able to make her appointments so she wants to get back on track.  VITAL SIGNS: Filed Vitals:   06/21/13 1512  BP: 124/84  Pulse: 84  Temp: 98.6 F (37 C)  Resp: 15    PHYSICAL EXAM: Physical exam reveals a very well-appearing 37 y.o.female in no apparent distress Neurologic: Awake, alert, oriented Psych: Bright affect, conversant Respiratory: Breathing even and unlabored. No stridor or wheezing Abdomen: Soft, nontender, nondistended to palpation. Incisions well-healed. No incisional hernias. Port easily palpated. Extremities: Atraumatic, good range of motion.  ASSESMENT: 37 y.o.  female  s/p AP-Standard lap-band.   PLAN: The patient's port was accessed with a 20G Huber needle without difficulty. Clear fluid was aspirated and 0.5 mL saline was added to the port to give a total predicted volume of 6 mL after confirmation of 5.5 mL in the band. The patient was able to swallow water without difficulty following the procedure and was instructed to take clear liquids for the next 24-48 hours and advance slowly as tolerated. We discussed the importance of avoiding carbohydrates and increasing physical activity. She voiced understanding and agreement. We'll schedule her to return in one month.

## 2013-06-21 NOTE — Patient Instructions (Signed)

## 2013-07-19 ENCOUNTER — Encounter (INDEPENDENT_AMBULATORY_CARE_PROVIDER_SITE_OTHER): Payer: BC Managed Care – PPO

## 2013-07-24 ENCOUNTER — Other Ambulatory Visit: Payer: Self-pay | Admitting: Physician Assistant

## 2013-07-26 ENCOUNTER — Other Ambulatory Visit: Payer: Self-pay | Admitting: Physician Assistant

## 2013-08-02 ENCOUNTER — Ambulatory Visit (INDEPENDENT_AMBULATORY_CARE_PROVIDER_SITE_OTHER): Payer: BC Managed Care – PPO | Admitting: Family Medicine

## 2013-08-02 VITALS — BP 126/80 | HR 80 | Temp 98.6°F | Resp 18 | Ht 60.75 in | Wt 264.0 lb

## 2013-08-02 DIAGNOSIS — I10 Essential (primary) hypertension: Secondary | ICD-10-CM

## 2013-08-02 DIAGNOSIS — E559 Vitamin D deficiency, unspecified: Secondary | ICD-10-CM

## 2013-08-02 DIAGNOSIS — E7800 Pure hypercholesterolemia, unspecified: Secondary | ICD-10-CM | POA: Insufficient documentation

## 2013-08-02 DIAGNOSIS — Z79899 Other long term (current) drug therapy: Secondary | ICD-10-CM

## 2013-08-02 DIAGNOSIS — E785 Hyperlipidemia, unspecified: Secondary | ICD-10-CM

## 2013-08-02 LAB — COMPREHENSIVE METABOLIC PANEL
ALT: 11 U/L (ref 0–35)
AST: 11 U/L (ref 0–37)
Albumin: 3.7 g/dL (ref 3.5–5.2)
Alkaline Phosphatase: 73 U/L (ref 39–117)
BUN: 10 mg/dL (ref 6–23)
Calcium: 9.1 mg/dL (ref 8.4–10.5)
Chloride: 105 mEq/L (ref 96–112)
Potassium: 4.2 mEq/L (ref 3.5–5.3)
Sodium: 140 mEq/L (ref 135–145)
Total Bilirubin: 0.3 mg/dL (ref 0.3–1.2)

## 2013-08-02 LAB — LIPID PANEL
HDL: 46 mg/dL (ref >39)
LDL Cholesterol: 106 mg/dL — ABNORMAL HIGH (ref 0–99)

## 2013-08-02 MED ORDER — HYDROCHLOROTHIAZIDE 12.5 MG PO CAPS: 12.5000 mg | ORAL_CAPSULE | ORAL | Status: DC

## 2013-08-02 MED ORDER — ATORVASTATIN CALCIUM 40 MG PO TABS: 40.0000 mg | ORAL_TABLET | Freq: Every day | ORAL | Status: DC

## 2013-08-02 NOTE — Patient Instructions (Signed)
Vitamin D Deficiency Vitamin D is an important vitamin that your body needs. Having too little of it in your body is called a deficiency. A very bad deficiency can make your bones soft and can cause a condition called rickets.  Vitamin D is important to your body for different reasons, such as:   It helps your body absorb 2 minerals called calcium and phosphorus.  It helps make your bones healthy.  It may prevent some diseases, such as diabetes and multiple sclerosis.  It helps your muscles and heart. You can get vitamin D in several ways. It is a natural part of some foods. The vitamin is also added to some dairy products and cereals. Some people take vitamin D supplements. Also, your body makes vitamin D when you are in the sun. It changes the sun's rays into a form of the vitamin that your body can use. CAUSES   Not eating enough foods that contain vitamin D.  Not getting enough sunlight.  Having certain digestive system diseases that make it hard to absorb vitamin D. These diseases include Crohn's disease, chronic pancreatitis, and cystic fibrosis.  Having a surgery in which part of the stomach or small intestine is removed.  Being obese. Fat cells pull vitamin D out of your blood. That means that obese people may not have enough vitamin D left in their blood and in other body tissues.  Having chronic kidney or liver disease. RISK FACTORS Risk factors are things that make you more likely to develop a vitamin D deficiency. They include:  Being older.  Not being able to get outside very much.  Living in a nursing home.  Having had broken bones.  Having weak or thin bones (osteoporosis).  Having a disease or condition that changes how your body absorbs vitamin D.  Having dark skin.  Some medicines such as seizure medicines or steroids.  Being overweight or obese. SYMPTOMS Mild cases of vitamin D deficiency may not have any symptoms. If you have a very bad case, symptoms  may include:  Bone pain.  Muscle pain.  Falling often.  Broken bones caused by a minor injury, due to osteoporosis. DIAGNOSIS A blood test is the best way to tell if you have a vitamin D deficiency. TREATMENT Vitamin D deficiency can be treated in different ways. Treatment for vitamin D deficiency depends on what is causing it. Options include:  Taking vitamin D supplements.  Taking a calcium supplement. Your caregiver will suggest what dose is best for you. HOME CARE INSTRUCTIONS  Take any supplements that your caregiver prescribes. Follow the directions carefully. Take only the suggested amount.  Have your blood tested 2 months after you start taking supplements.  Eat foods that contain vitamin D. Healthy choices include:  Fortified dairy products, cereals, or juices. Fortified means vitamin D has been added to the food. Check the label on the package to be sure.  Fatty fish like salmon or trout.  Eggs.  Oysters.  Do not use a tanning bed.  Keep your weight at a healthy level. Lose weight if you need to.  Keep all follow-up appointments. Your caregiver will need to perform blood tests to make sure your vitamin D deficiency is going away. SEEK MEDICAL CARE IF:  You have any questions about your treatment.  You continue to have symptoms of vitamin D deficiency.  You have nausea or vomiting.  You are constipated.  You feel confused.  You have severe abdominal or back pain. MAKE   SURE YOU:  Understand these instructions.  Will watch your condition.  Will get help right away if you are not doing well or get worse. Document Released: 10/25/2011 Document Revised: 11/27/2012 Document Reviewed: 10/25/2011 [facility] Patient Information 2014 [place], [place]. Calcium; Vitamin D chewable tablet What is this medicine? CALCIUM; VITAMIN D (KAL see um; VYE ta min D) is a vitamin supplement. It is used to prevent conditions of low calcium and vitamin D. This medicine may  be used for other purposes; ask your health care provider or pharmacist if you have questions. COMMON BRAND NAME(S): Calcet Citrate Soft Chew , Caltrate 600+D, Caltrate Gummy Bites, Citracal Calcium, Citracal Creamy Bites, Os-Cal 500 Plus D, OSCAL with Vitamin D3, Oysco 500 + D  What should I tell my health care provider before I take this medicine? They need to know if you have any of these conditions: -constipation -dehydration -heart disease -high level of calcium or vitamin D in the blood -high level of phosphate in the blood -kidney disease -kidney stones -liver disease -parathyroid disease -sarcoidosis -stomach ulcer or obstruction -an unusual or allergic reaction to calcium, vitamin D, tartrazine dye, other medicines, foods, dyes, or preservatives -pregnant or trying to get pregnant -breast-feeding How should I use this medicine? Take this medicine by mouth. Chew it completely before swallowing. Follow the directions on the prescription label. Take with food or within 1 hour after a meal. Take your medicine at regular intervals. Do not take your medicine more often than directed. Talk to your pediatrician regarding the use of this medicine in children. While this medicine may be used in children for selected conditions, precautions do apply. Overdosage: If you think you have taken too much of this medicine contact a poison control center or emergency room at once. NOTE: This medicine is only for you. Do not share this medicine with others. What if I miss a dose? If you miss a dose, take it as soon as you can. If it is almost time for your next dose, take only that dose. Do not take double or extra doses. What may interact with this medicine? Do not take this medicine with any of the following medications: -ammonium chloride -methenamine This medicine may also interact with the following medications: -antibiotics like ciprofloxacin, gatifloxacin,  tetracycline -captopril -delavirdine -diuretics -gabapentin -iron supplements -medicines for fungal infections like ketoconazole and itraconazole -medicines for seizures like ethotoin and phenytoin -mineral oil -mycophenolate -other vitamins with calcium, vitamin D, or minerals -quinidine -rosuvastatin -sucralfate -thyroid medicine This list may not describe all possible interactions. Give your health care provider a list of all the medicines, herbs, non-prescription drugs, or dietary supplements you use. Also tell them if you smoke, drink alcohol, or use illegal drugs. Some items may interact with your medicine. What should I watch for while using this medicine? Taking this medicine is not a substitute for a well-balanced diet and exercise. Talk with your doctor or health care provider and follow a healthy lifestyle. Do not take this medicine with high-fiber foods, large amounts of alcohol, or drinks containing caffeine. Do not take this medicine within 2 hours of any other medicines. What side effects may I notice from receiving this medicine? Side effects that you should report to your doctor or health care professional as soon as possible: -allergic reactions like skin rash, itching or hives, swelling of the face, lips, or tongue -confusion -dry mouth -high blood pressure -increased hunger or thirst -increased urination -irregular heartbeat -metallic taste -muscle or bone pain -  pain when urinating -seizure -unusually weak or tired -weight loss Side effects that usually do not require medical attention (report to your doctor or health care professional if they continue or are bothersome): -constipation -diarrhea -headache -loss of appetite -nausea, vomiting -stomach upset This list may not describe all possible side effects. Call your doctor for medical advice about side effects. You may report side effects to FDA at 1-800-FDA-1088. Where should I keep my medicine? Keep  out of the reach of children. Store at room temperature between 15 and 30 degrees C (59 and 86 degrees F). Protect from light. Keep container tightly closed. Throw away any unused medicine after the expiration date. NOTE: This sheet is a summary. It may not cover all possible information. If you have questions about this medicine, talk to your doctor, pharmacist, or health care provider.  2014, Elsevier/Gold Standard. (2007-11-15 17:57:27)

## 2013-08-02 NOTE — Progress Notes (Addendum)
Subjective:    Patient ID: [name], female    DOB: 04-Jan-1976, 37 y.o.   MRN: [id number] This chart was scribed for Norberto Sorenson, MD by Danella Maiers, ED Scribe. This patient was seen in room 1 and the patient's care was started at 9:34 AM.  Chief Complaint  Patient presents with  . Medication Refill    Atorvastatin, hydrochlorothiazide, Vitamin D    HPI HPI Comments: [name] is a 37 y.o. female with a h/o HTN, HPL, vit D def, and obesity w/ lap band plcmnt who presents to the [facility] for a medication refill on atorvastatin, HCTZ, and Vitamin D. She is not taking any OTC vitamin D. She ran out of her rx vitamin D replacement just one week ago. She has not eaten today.  No med side effects or concerns today. Is fasting.  PCP - Garth Schlatter, MD  Past Medical History  Diagnosis Date  . Hypertension   . Hyperlipidemia   . Anemia    Current Outpatient Prescriptions on File Prior to Visit  Medication Sig Dispense Refill  . atorvastatin (LIPITOR) 40 MG tablet Take 1 tablet (40 mg total) by mouth daily. PATIENT NEEDS OFFICE VISIT FOR ADDITIONAL REFILLS -  2nd NOTICE  30 tablet  0  . hydrochlorothiazide (MICROZIDE) 12.5 MG capsule Take 1 capsule (12.5 mg total) by mouth every morning. PATIENT NEEDS OFFICE VISIT FOR ADDITIONAL REFILLS - 2nd NOTICE  30 capsule  0  . Vitamin D, Ergocalciferol, (DRISDOL) 50000 UNITS CAPS Take 1 capsule (50,000 Units total) by mouth every 7 (seven) days.  30 capsule  0   No current facility-administered medications on file prior to visit.   No Known Allergies   Review of Systems  Constitutional: Negative for fever, chills, diaphoresis and appetite change.  Eyes: Negative for visual disturbance.  Respiratory: Negative for cough and shortness of breath.   Cardiovascular: Negative for chest pain, palpitations and leg swelling.  Genitourinary: Negative for decreased urine volume.  Skin: Negative for rash.    Neurological: Negative for syncope and headaches.  Hematological: Does not bruise/bleed easily.      BP 126/80  Pulse 80  Temp(Src) 98.6 F (37 C) (Oral)  Resp 18  Ht 5' 0.75" (1.543 m)  Wt 264 lb (119.75 kg)  BMI 50.30 kg/m2  SpO2 99%  Objective:   Physical Exam  Nursing note and vitals reviewed. Constitutional: She is oriented to person, place, and time. She appears well-developed and well-nourished. No distress.  HENT:  Head: Normocephalic and atraumatic.  Eyes: EOM are normal.  Neck: Neck supple. No tracheal deviation present.  Cardiovascular: Normal rate and regular rhythm.   Could not hear heart sounds well due to body habitus  Pulmonary/Chest: Effort normal and breath sounds normal. No respiratory distress. She has no wheezes.  Musculoskeletal: Normal range of motion.  Neurological: She is alert and oriented to person, place, and time.  Skin: Skin is warm and dry.  Psychiatric: She has a normal mood and affect. Her behavior is normal.      Assessment & Plan:   Other and unspecified hyperlipidemia - Plan: Lipid panel - lipid panel at goal - pt at low risk despite htn and obesity due to age and htg well controlled. Decrease lipitor from 40 to 20 and recheck in 6-12 mos at next OV.  Hypertension - Plan: Comprehensive metabolic panel - well controlled, cont hctz 12.5  Encounter for long-term (current) use of other  medications  Unspecified vitamin D deficiency - Plan: Vit D  25 hydroxy (rtn osteoporosis monitoring) - 2 more mos of rx therapy - then change to otc - recheck at next OV.  Meds ordered this encounter  Medications  . atorvastatin (LIPITOR) 40 MG tablet    Sig: Take 1 tablet (40 mg total) by mouth daily.    Dispense:  90 tablet    Refill:  2  . hydrochlorothiazide (MICROZIDE) 12.5 MG capsule    Sig: Take 1 capsule (12.5 mg total) by mouth every morning.    Dispense:  90 capsule    Refill:  2    I personally performed the services described in this  documentation, which was scribed in my presence. The recorded information has been reviewed and considered, and addended by me as needed.  Norberto Sorenson, MD MPH

## 2013-08-03 LAB — VITAMIN D 25 HYDROXY (VIT D DEFICIENCY, FRACTURES): Vit D, 25-Hydroxy: 27 ng/mL — ABNORMAL LOW (ref 30–89)

## 2013-08-05 ENCOUNTER — Encounter: Payer: Self-pay | Admitting: Family Medicine

## 2013-08-05 MED ORDER — VITAMIN D (ERGOCALCIFEROL) 1.25 MG (50000 UNIT) PO CAPS: 50000.0000 [IU] | ORAL_CAPSULE | ORAL | Status: DC

## 2013-08-05 MED ORDER — ATORVASTATIN CALCIUM 20 MG PO TABS: 20.0000 mg | ORAL_TABLET | Freq: Every day | ORAL | Status: DC

## 2013-08-05 NOTE — Addendum Note (Signed)
Addended by: Norberto Sorenson on: 08/05/2013 10:55 AM   Modules accepted: Orders

## 2013-12-31 ENCOUNTER — Telehealth (INDEPENDENT_AMBULATORY_CARE_PROVIDER_SITE_OTHER): Payer: Self-pay

## 2013-12-31 NOTE — Telephone Encounter (Signed)
Patient states she has N/V since 12-27-13, she is able to sip fluids without vomiting. Appointment with Dr. Ezzard StandingNewman 01-02-14 @ 1230. Advised her if her condition becomes worse to go to the ED. Patient verbalized understanding

## 2013-12-31 NOTE — Telephone Encounter (Signed)
Pt called stating last Thursday pt started having difficulty getting POs down. She states she would wake up at night with a choking sensation. Pt states she can only keep small sips of fluid down. No solids. They come back up. No abd pain. Voiding well. BMs normal. Pt states she has had to come in before to have all fluid removed from band. Pt advised to continue fluids to avoid dehydration. Pt was last seen one year ago by [place] the PA.Pt advised if she becomes unable to keep fluids down or abd pain occurs she needs to call back asap or go to ER.  Pt advised since Dr Ezzard StandingNewman place her band we will send this concern to Dr Ezzard StandingNewman and his assistant for review. Pt can be reached at [phone].

## 2014-01-01 ENCOUNTER — Other Ambulatory Visit: Payer: Self-pay | Admitting: Emergency Medicine

## 2014-01-02 ENCOUNTER — Encounter (INDEPENDENT_AMBULATORY_CARE_PROVIDER_SITE_OTHER): Payer: Self-pay | Admitting: Surgery

## 2014-01-02 ENCOUNTER — Other Ambulatory Visit (INDEPENDENT_AMBULATORY_CARE_PROVIDER_SITE_OTHER): Payer: Self-pay

## 2014-01-02 ENCOUNTER — Ambulatory Visit (INDEPENDENT_AMBULATORY_CARE_PROVIDER_SITE_OTHER): Payer: BC Managed Care – PPO | Admitting: Surgery

## 2014-01-02 VITALS — BP 134/78 | HR 68 | Temp 98.0°F | Resp 18 | Ht 60.0 in | Wt 257.0 lb

## 2014-01-02 DIAGNOSIS — Z9884 Bariatric surgery status: Secondary | ICD-10-CM

## 2014-01-02 DIAGNOSIS — R112 Nausea with vomiting, unspecified: Secondary | ICD-10-CM

## 2014-01-02 NOTE — Telephone Encounter (Signed)
LMOM for pt w/Dr Alver FisherShaw's instr's on last lab notes that pt was to change to OTC 1000-2000 units QD and then we would recheck lab at next OV. Advised pt to start this dose and RTC in about a month for f/up OV.

## 2014-01-02 NOTE — Progress Notes (Addendum)
[facility]  Diane Kinavid Ramzey Petrovic, Bishop,  FACS [address]  [address] [place], [place]    [id number] Phone:  [phone] FAX:  [phone]   Re:   [name] DOB:   11-29-1975 MRN:   [id number]  ASSESSMENT AND PLAN: 1.  History of lap band, AP Standard, placed 04/08/2008 - [name]  Lap band is too tight.  I removed 3.0 cc to leave 2.5 cc.  We will get an UGI to evaluate the lap band for any change in anatomy.  I'll see her back in 4 weeks. [UGI - 01/11/2014 - small HH, Lap band internal diameter 14 mm.  DN 01/14/2014]  2.  History of hypertension 3.  History of hypercholesterolemia  HISTORY OF PRESENT ILLNESS: Chief Complaint  Patient presents with  . Lap Band Fill    Nausea   [name] is a 38 y.o. (DOB: 11-29-1975)  AA  female who is a patient of [name] and comes to me today for follow up of lap band. She comes by herself.  I have not seen [name] for since 09/10/2010.  She has seen [name] for visits.  He last saw her 06/21/2013.  Her best weight was 214 on 11/16/2012. He has developed several issues in eating. These issues began last week in which she could not take water. Though it sounds like it has been going on much longer than that.  She gets coughs at night. When she eats and tries to carry on a conversation, she girgles from her esophagus. She did not understand why she is so large when she should be since she still little. She vomits daily. She wakes up in the morning, which is around 6:30, she cannot eat for some 6 hours. He story goes along with a lap band that is too tight.  I doubt the band has slipped, because she has never lost a significant amount of weight. She really wants an UGI to see what is going on.  I don't have evidence of the lap band slipping, but I think that it is worthwhile.  Past Medical History  Diagnosis Date  . Hypertension   . Hyperlipidemia   . Anemia     SOCIAL HISTORY:   PHYSICAL EXAM: BP  134/78  Pulse 68  Temp(Src) 98 F (36.7 C)  Resp 18  Ht 5' (1.524 m)  Wt 257 lb (116.574 kg)  BMI 50.19 kg/m2 General: Obese AA F who is alert and generally healthy appearing.  HEENT: Normal. Pupils equal.  Neck: Supple. No mass.  No thyroid mass.   Abdomen: Soft. No mass. Her lap band is hard to feel.  It sits 1 cm lateral and 2 cm below the scar for the lap band.  After I hit the lap band, I took a picture below of the location.  Note the "X' marks the spot.   Port site marked with "x"  Procedure:  I had difficulty sticking her lap band.  I used the US to identify the port.  I removed 3.0 cc and left 2.5 cc.  DATA REVIEWED: Epic notes   Diane Kinavid Nagee Goates, Bishop,  [facility], [place] [address]  [address]   [place], [place]    [id number] Phone:  [phone] FAX:  [phone]

## 2014-01-11 ENCOUNTER — Ambulatory Visit (HOSPITAL_COMMUNITY)
Admission: RE | Admit: 2014-01-11 | Discharge: 2014-01-11 | Disposition: A | Discharge status: Home / Self Care | Payer: BC Managed Care – PPO | Source: Ambulatory Visit | Attending: Surgery | Admitting: Surgery

## 2014-01-11 DIAGNOSIS — K449 Diaphragmatic hernia without obstruction or gangrene: Secondary | ICD-10-CM | POA: Insufficient documentation

## 2014-01-11 DIAGNOSIS — R112 Nausea with vomiting, unspecified: Secondary | ICD-10-CM | POA: Insufficient documentation

## 2014-02-05 ENCOUNTER — Other Ambulatory Visit: Payer: Self-pay | Admitting: *Deleted

## 2014-02-05 MED ORDER — ATORVASTATIN CALCIUM 20 MG PO TABS: 20.0000 mg | ORAL_TABLET | Freq: Every day | ORAL | Status: DC

## 2014-02-05 MED ORDER — HYDROCHLOROTHIAZIDE 12.5 MG PO CAPS: 12.5000 mg | ORAL_CAPSULE | ORAL | Status: DC

## 2014-02-05 NOTE — Telephone Encounter (Signed)
Received fax for refill request for Lipitor and HCTZ. Sent in 1 refill for each. Lm for pt to Rtn call to schedule an appt.

## 2014-07-24 ENCOUNTER — Telehealth: Payer: Self-pay

## 2014-07-24 MED ORDER — ATORVASTATIN CALCIUM 20 MG PO TABS: 20.0000 mg | ORAL_TABLET | Freq: Every day | ORAL | Status: DC

## 2014-07-24 MED ORDER — HYDROCHLOROTHIAZIDE 12.5 MG PO CAPS: 12.5000 mg | ORAL_CAPSULE | ORAL | Status: DC

## 2014-07-24 NOTE — Telephone Encounter (Signed)
Pt called and said that she needs RFs of lipitor and HCTZ sent to [facility]. I advised that she is very overdue for OV, we had LMOM asking her to come in when we last sent in RFs in June. Pt agreed to come in, sent in 30 days locally to cover her until she can get in.

## 2014-08-29 ENCOUNTER — Other Ambulatory Visit: Payer: Self-pay | Admitting: Physician Assistant

## 2014-08-30 ENCOUNTER — Other Ambulatory Visit: Payer: Self-pay | Admitting: Physician Assistant

## 2014-09-04 ENCOUNTER — Ambulatory Visit: Payer: Self-pay | Admitting: Family Medicine

## 2014-09-10 ENCOUNTER — Ambulatory Visit: Payer: Self-pay | Admitting: Family Medicine

## 2014-09-11 ENCOUNTER — Encounter: Payer: Self-pay | Admitting: Family Medicine

## 2014-09-11 ENCOUNTER — Ambulatory Visit (INDEPENDENT_AMBULATORY_CARE_PROVIDER_SITE_OTHER): Payer: BLUE CROSS/BLUE SHIELD | Admitting: Family Medicine

## 2014-09-11 VITALS — BP 140/80 | HR 56 | Temp 98.2°F | Resp 16 | Ht 60.0 in | Wt 283.0 lb

## 2014-09-11 DIAGNOSIS — E559 Vitamin D deficiency, unspecified: Secondary | ICD-10-CM

## 2014-09-11 DIAGNOSIS — E7800 Pure hypercholesterolemia, unspecified: Secondary | ICD-10-CM

## 2014-09-11 DIAGNOSIS — Z6841 Body Mass Index (BMI) 40.0 and over, adult: Secondary | ICD-10-CM

## 2014-09-11 DIAGNOSIS — I10 Essential (primary) hypertension: Secondary | ICD-10-CM

## 2014-09-11 LAB — COMPREHENSIVE METABOLIC PANEL
ALT: 13 U/L (ref 0–35)
AST: 13 U/L (ref 0–37)
Albumin: 3.7 g/dL (ref 3.5–5.2)
Alkaline Phosphatase: 75 U/L (ref 39–117)
BUN: 12 mg/dL (ref 6–23)
CO2: 27 meq/L (ref 19–32)
CREATININE: 0.7 mg/dL (ref 0.50–1.10)
Calcium: 9.4 mg/dL (ref 8.4–10.5)
Chloride: 103 mEq/L (ref 96–112)
GLUCOSE: 87 mg/dL (ref 70–99)
Potassium: 3.9 mEq/L (ref 3.5–5.3)
SODIUM: 138 meq/L (ref 135–145)
Total Bilirubin: 0.4 mg/dL (ref 0.2–1.2)
Total Protein: 7.1 g/dL (ref 6.0–8.3)

## 2014-09-11 LAB — TSH: TSH: 0.631 u[IU]/mL (ref 0.350–4.500)

## 2014-09-11 LAB — LIPID PANEL
Cholesterol: 230 mg/dL — ABNORMAL HIGH (ref 0–200)
HDL: 48 mg/dL (ref >39)
LDL CALC: 158 mg/dL — AB (ref 0–99)
Total CHOL/HDL Ratio: 4.8 Ratio
Triglycerides: 121 mg/dL (ref <150)
VLDL: 24 mg/dL (ref 0–40)

## 2014-09-11 LAB — HEMOGLOBIN A1C
HEMOGLOBIN A1C: 6.2 % — AB (ref <5.7)
Mean Plasma Glucose: 131 mg/dL — ABNORMAL HIGH (ref <117)

## 2014-09-11 MED ORDER — HYDROCHLOROTHIAZIDE 25 MG PO TABS: 25.0000 mg | ORAL_TABLET | Freq: Every day | ORAL | Status: DC

## 2014-09-11 MED ORDER — ATORVASTATIN CALCIUM 20 MG PO TABS
20.0000 mg | ORAL_TABLET | Freq: Every day | ORAL | Status: DC
Start: 2014-09-11 — End: 2014-09-11

## 2014-09-11 MED ORDER — ATORVASTATIN CALCIUM 20 MG PO TABS: 20.0000 mg | ORAL_TABLET | Freq: Every day | ORAL | Status: DC

## 2014-09-11 MED ORDER — VITAMIN D (ERGOCALCIFEROL) 1.25 MG (50000 UNIT) PO CAPS: 50000.0000 [IU] | ORAL_CAPSULE | ORAL | Status: DC

## 2014-09-11 NOTE — Progress Notes (Signed)
   Subjective:    Patient ID: [name], female    DOB: 1976-07-16, 39 y.o.   MRN: [id number]  HPI  This is a very pleasant 39 yo female who presents today for follow up of HTN, hypercholesterolemia.  Tolerating meds well. Is interested in having HCTZ dose increased from 12.5 mg. She reports puffiness of hands and feet throughout the day.  Sees gyn annually. Up to date on PAP, mammo.  She had lap band surgery 2009, but feels it is not longer working for her. She is not happy about her weight gain and reports that "she knows what to do." Her stress level has been very high over the last several months. She has 4 daughters, 3 of which are teenagers with 2 in college. She works from home and likes her job, but stays incredibly busy with her children and her church.   Review of Systems No chest pain, no SOB, no nausea, no vomiting, no diarrhea, no constipation    Objective:   Physical Exam  Constitutional: She is oriented to person, place, and time. She appears well-developed and well-nourished.  HENT:  Head: Normocephalic and atraumatic.  Eyes: Conjunctivae are normal.  Neck: Normal range of motion. Neck supple.  Cardiovascular: Normal rate, regular rhythm and normal heart sounds.   Pulmonary/Chest: Effort normal and breath sounds normal.  Musculoskeletal: Normal range of motion. Edema: trace pretibial.  Neurological: She is alert and oriented to person, place, and time.  Skin: Skin is warm and dry.  Psychiatric: She has a normal mood and affect. Her behavior is normal. Judgment and thought content normal.  Vitals reviewed. BP 140/80 mmHg  Pulse 56  Temp(Src) 98.2 F (36.8 C) (Oral)  Resp 16  Ht 5' (1.524 m)  Wt 283 lb (128.368 kg)  BMI 55.27 kg/m2  SpO2 100% Wt Readings from Last 3 Encounters:  09/11/14 283 lb (128.368 kg)  01/02/14 257 lb (116.574 kg)  08/02/13 264 lb (119.75 kg)      Assessment & Plan:  1. Essential hypertension - Comprehensive metabolic panel -  hydrochlorothiazide (HYDRODIURIL) 25 MG tablet; Take 1 tablet (25 mg total) by mouth daily.  Dispense: 90 tablet; Refill: 3  2. Elevated cholesterol - Lipid panel - atorvastatin (LIPITOR) 20 MG tablet; Take 1 tablet (20 mg total) by mouth daily at 6 PM.  Dispense: 90 tablet; Refill: 3  3. Vitamin D deficiency - Vitamin D, Ergocalciferol, (DRISDOL) 50000 UNITS CAPS capsule; Take 1 capsule (50,000 Units total) by mouth every 7 (seven) days.  Dispense: 12 capsule; Refill: 3  4. Morbid obesity with BMI of 50.0-59.9, adult - offered nutrition consult to patient- she is not interested at this time - discussed barriers she is experiencing to making better choices and provided encouragement - Comprehensive metabolic panel - TSH - Lipid panel - Hemoglobin A1C - Encouraged 1/2-1 pound weight loss per week and increased activity - follow up in 6 months  Emi Belfasteborah B. Vinnie Bobst, FNP-BC  [facility], [facility]  09/13/2014 6:22 AM

## 2014-09-11 NOTE — Patient Instructions (Signed)

## 2015-01-14 ENCOUNTER — Ambulatory Visit: Payer: Self-pay | Admitting: Family Medicine

## 2015-08-17 ENCOUNTER — Other Ambulatory Visit: Payer: Self-pay | Admitting: Family Medicine

## 2015-09-12 ENCOUNTER — Other Ambulatory Visit: Payer: Self-pay | Admitting: Family Medicine

## 2015-10-20 ENCOUNTER — Other Ambulatory Visit: Payer: Self-pay | Admitting: Family Medicine

## 2015-10-23 ENCOUNTER — Other Ambulatory Visit: Payer: Self-pay

## 2015-10-23 DIAGNOSIS — I10 Essential (primary) hypertension: Secondary | ICD-10-CM

## 2015-10-23 DIAGNOSIS — E7800 Pure hypercholesterolemia, unspecified: Secondary | ICD-10-CM

## 2015-10-23 MED ORDER — HYDROCHLOROTHIAZIDE 25 MG PO TABS: 25.0000 mg | ORAL_TABLET | Freq: Every day | ORAL | Status: AC

## 2015-10-23 MED ORDER — ATORVASTATIN CALCIUM 20 MG PO TABS
20.0000 mg | ORAL_TABLET | Freq: Every day | ORAL | Status: AC
Start: 2015-10-23 — End: ?

## 2016-11-09 ENCOUNTER — Encounter (HOSPITAL_COMMUNITY): Payer: Self-pay

## 2017-11-09 ENCOUNTER — Encounter (HOSPITAL_COMMUNITY): Payer: Self-pay

## 2020-01-12 ENCOUNTER — Ambulatory Visit: Payer: Self-pay | Attending: Internal Medicine

## 2020-01-12 DIAGNOSIS — Z23 Encounter for immunization: Secondary | ICD-10-CM

## 2020-01-12 NOTE — Progress Notes (Signed)
   Covid-19 Vaccination Clinic  Name:  DANESSA MENSCH    MRN: [id number] DOB: 09/03/1975  01/12/2020  Ms. Cirrincione was observed post Covid-19 immunization for 15 minutes without incident. She was provided with Vaccine Information Sheet and instruction to access the V-Safe system.   Ms. Greenwood was instructed to call 911 with any severe reactions post vaccine: Marland Kitchen Difficulty breathing  . Swelling of face and throat  . A fast heartbeat  . A bad rash all over body  . Dizziness and weakness   Immunizations Administered    Name Date Dose VIS Date Route   Pfizer COVID-19 Vaccine 01/12/2020  8:12 AM 0.3 mL 10/10/2018 Intramuscular   Manufacturer: [employer], [employer]   Lot: [id number]   NDC: [address]-2

## 2020-02-04 ENCOUNTER — Ambulatory Visit: Payer: Self-pay

## 2023-01-25 ENCOUNTER — Ambulatory Visit
Admission: EM | Admit: 2023-01-25 | Discharge: 2023-01-25 | Disposition: A | Discharge status: Home / Self Care | Payer: BC Managed Care – PPO | Attending: Nurse Practitioner | Admitting: Nurse Practitioner

## 2023-01-25 DIAGNOSIS — B9789 Other viral agents as the cause of diseases classified elsewhere: Secondary | ICD-10-CM | POA: Insufficient documentation

## 2023-01-25 DIAGNOSIS — J069 Acute upper respiratory infection, unspecified: Secondary | ICD-10-CM | POA: Insufficient documentation

## 2023-01-25 DIAGNOSIS — Z1152 Encounter for screening for COVID-19: Secondary | ICD-10-CM | POA: Insufficient documentation

## 2023-01-25 DIAGNOSIS — J029 Acute pharyngitis, unspecified: Secondary | ICD-10-CM | POA: Insufficient documentation

## 2023-01-25 LAB — POCT RAPID STREP A (OFFICE): Rapid Strep A Screen: NEGATIVE

## 2023-01-25 MED ORDER — IPRATROPIUM BROMIDE 0.03 % NA SOLN
2.0000 | Freq: Two times a day (BID) | NASAL | 0 refills | Status: AC
Start: 2023-01-25 — End: ?

## 2023-01-25 MED ORDER — BENZONATATE 200 MG PO CAPS
200.0000 mg | ORAL_CAPSULE | Freq: Three times a day (TID) | ORAL | 0 refills | Status: AC | PRN
Start: 2023-01-25 — End: ?

## 2023-01-25 NOTE — ED Provider Notes (Signed)
[facility]    CSN: [id number] Arrival date & time: 01/25/23  0807      History   Chief Complaint Chief Complaint  Patient presents with   Nasal Congestion   Cough    HPI [name] is a 47 y.o. female  presents for evaluation of URI symptoms for 3 days. Patient reports associated symptoms of sore throat, congestion, cough, sinus pressure. Denies N/V/D, fevers or ear pain, body aches, shortness of breath. Patient does not have a hx of asthma or smoking. No known sick contacts.  Pt has taken Sudafed, Mucinex, Benadryl OTC for symptoms.  Have history of hypertension.  Has not taken her BP meds today.  Denies headache, chest pain, shortness of breath.  Pt has no other concerns at this time.   Cough Associated symptoms: sore throat     Past Medical History:  Diagnosis Date   Anemia    Hyperlipidemia    Hypertension     Patient Active Problem List   Diagnosis Date Noted   History of laparoscopic adjustable gastric banding, 04/08/2008 01/02/2014   Morbid obesity with BMI of 50.0-59.9, adult (HCC) 08/05/2013   Elevated cholesterol 08/02/2013   Hypertension 08/02/2013   Encounter for long-term (current) use of other medications 08/02/2013   Vitamin D deficiency 08/02/2013    Past Surgical History:  Procedure Laterality Date   BREAST SURGERY     LAPAROSCOPIC GASTRIC BANDING     oblation     TUBAL LIGATION      OB History   No obstetric history on file.      Home Medications    Prior to Admission medications   Medication Sig Start Date End Date Taking? Authorizing Provider  benzonatate (TESSALON) 200 MG capsule Take 1 capsule (200 mg total) by mouth 3 (three) times daily as needed for cough. 01/25/23  Yes Radford Pax, NP  ipratropium (ATROVENT) 0.03 % nasal spray Place 2 sprays into both nostrils every 12 (twelve) hours. 01/25/23  Yes Radford Pax, NP  atorvastatin (LIPITOR) 20 MG tablet Take 1 tablet (20 mg total) by mouth daily at 6 PM. 10/23/15    Emi Belfast, FNP  hydrochlorothiazide (HYDRODIURIL) 25 MG tablet Take 1 tablet (25 mg total) by mouth daily. 10/23/15   Emi Belfast, FNP  Vitamin D, Ergocalciferol, (DRISDOL) 50000 units CAPS capsule TAKE 1 CAPSULE EVERY SEVEN DAYS 08/18/15   Emi Belfast, FNP    Family History Family History  Problem Relation Age of Onset   Hypertension Maternal Grandmother    Diabetes Maternal Grandmother    Diabetes Maternal Grandfather    Hypertension Maternal Grandfather     Social History Social History   Tobacco Use   Smoking status: Never  Substance Use Topics   Alcohol use: No    Comment: once in a blue moon   Drug use: No     Allergies   Patient has no known allergies.   Review of Systems Review of Systems  HENT:  Positive for congestion, sinus pressure and sore throat.   Respiratory:  Positive for cough.      Physical Exam Triage Vital Signs ED Triage Vitals  Enc Vitals Group     BP 01/25/23 0828 (!) 173/133     Pulse Rate 01/25/23 0828 91     Resp --      Temp 01/25/23 0828 98.3 F (36.8 C)     Temp Source 01/25/23 0828 Oral     SpO2 01/25/23  0828 95 %     Weight --      Height --      Head Circumference --      Peak Flow --      Pain Score 01/25/23 0827 3     Pain Loc --      Pain Edu? --      Excl. in GC? --    No data found.  Updated Vital Signs BP (!) 173/133 (BP Location: Left Arm)   Pulse 91   Temp 98.3 F (36.8 C) (Oral)   Resp 17   SpO2 95%   Visual Acuity Right Eye Distance:   Left Eye Distance:   Bilateral Distance:    Right Eye Near:   Left Eye Near:    Bilateral Near:     Physical Exam Vitals and nursing note reviewed.  Constitutional:      General: She is not in acute distress.    Appearance: She is well-developed. She is not ill-appearing.  HENT:     Head: Normocephalic and atraumatic.     Right Ear: Tympanic membrane and ear canal normal.     Left Ear: Tympanic membrane and ear canal normal.     Nose:  Congestion present.     Mouth/Throat:     Mouth: Mucous membranes are moist.     Pharynx: Oropharynx is clear. Uvula midline. Posterior oropharyngeal erythema present.     Tonsils: No tonsillar exudate or tonsillar abscesses.  Eyes:     Conjunctiva/sclera: Conjunctivae normal.     Pupils: Pupils are equal, round, and reactive to light.  Cardiovascular:     Rate and Rhythm: Normal rate and regular rhythm.     Heart sounds: Normal heart sounds.  Pulmonary:     Effort: Pulmonary effort is normal.     Breath sounds: Normal breath sounds.  Musculoskeletal:     Cervical back: Normal range of motion and neck supple.  Lymphadenopathy:     Cervical: No cervical adenopathy.  Skin:    General: Skin is warm and dry.  Neurological:     General: No focal deficit present.     Mental Status: She is alert and oriented to person, place, and time.  Psychiatric:        Mood and Affect: Mood normal.        Behavior: Behavior normal.      UC Treatments / Results  Labs (all labs ordered are listed, but only abnormal results are displayed) Labs Reviewed  SARS CORONAVIRUS 2 (TAT 6-24 HRS)  CULTURE, GROUP A STREP [facility])  POCT RAPID STREP A (OFFICE)    EKG   Radiology No results found.  Procedures Procedures (including critical care time)  Medications Ordered in UC Medications - No data to display  Initial Impression / Assessment and Plan / UC Course  I have reviewed the triage vital signs and the nursing notes.  Pertinent labs & imaging results that were available during my care of the patient were reviewed by me and considered in my medical decision making (see chart for details).     Negative rapid strep, will culture COVID PCR and will contact if positive Discussed viral illness and symptomatic treatment Discussed BP likely elevated secondary to her taking Sudafed and Mucinex as well as her not have taken her blood pressure medications yet today.  Advised to avoid OTC cough  medication/decongestants.  Will do trial of Tessalon Atrovent nasal spray as needed for congestion Rest and fluids PCP follow-up if  symptoms do not improve ER precautions reviewed and patient verbalized understanding Final Clinical Impressions(s) / UC Diagnoses   Final diagnoses:  Sore throat  Viral upper respiratory illness     Discharge Instructions      Your strep test was negative.  The clinic will contact you with results your COVID test is positive Please take Tessalon as needed for cough.  Avoid over-the-counter cough medicines as this can raise your blood pressure including Sudafed Atrovent nasal spray for congestion Rest and fluids Follow-up with your PCP if your symptoms do not improve Please go to the ER if you develop any worsening symptoms Hope you feel better soon!     ED Prescriptions     Medication Sig Dispense Auth. Provider   benzonatate (TESSALON) 200 MG capsule Take 1 capsule (200 mg total) by mouth 3 (three) times daily as needed for cough. 20 capsule Radford Pax, NP   ipratropium (ATROVENT) 0.03 % nasal spray Place 2 sprays into both nostrils every 12 (twelve) hours. 30 mL Radford Pax, NP      PDMP not reviewed this encounter.   Radford Pax, NP 01/25/23 [phone]

## 2023-01-25 NOTE — ED Triage Notes (Signed)
Pt presents with c/o nasal congestion and sinus pressure, itchy throat X 4 days.   C/o irritated eyes.   Home interventions: sudafed, mucinex and benadryl.

## 2023-01-25 NOTE — Discharge Instructions (Signed)
Your strep test was negative.  The clinic will contact you with results your COVID test is positive Please take Tessalon as needed for cough.  Avoid over-the-counter cough medicines as this can raise your blood pressure including Sudafed Atrovent nasal spray for congestion Rest and fluids Follow-up with your PCP if your symptoms do not improve Please go to the ER if you develop any worsening symptoms Hope you feel better soon!

## 2023-01-26 LAB — SARS CORONAVIRUS 2 (TAT 6-24 HRS): SARS Coronavirus 2: NEGATIVE

## 2023-01-27 LAB — CULTURE, GROUP A STREP (THRC)

## 2023-10-19 ENCOUNTER — Ambulatory Visit: Payer: Self-pay

## 2023-10-19 ENCOUNTER — Encounter: Payer: Self-pay | Admitting: Emergency Medicine

## 2023-10-19 ENCOUNTER — Ambulatory Visit
Admission: EM | Admit: 2023-10-19 | Discharge: 2023-10-19 | Disposition: A | Discharge status: Home / Self Care | Attending: Internal Medicine | Admitting: Internal Medicine

## 2023-10-19 DIAGNOSIS — J069 Acute upper respiratory infection, unspecified: Secondary | ICD-10-CM | POA: Insufficient documentation

## 2023-10-19 DIAGNOSIS — Z79899 Other long term (current) drug therapy: Secondary | ICD-10-CM | POA: Diagnosis not present

## 2023-10-19 DIAGNOSIS — I10 Essential (primary) hypertension: Secondary | ICD-10-CM | POA: Diagnosis not present

## 2023-10-19 DIAGNOSIS — B9789 Other viral agents as the cause of diseases classified elsewhere: Secondary | ICD-10-CM | POA: Insufficient documentation

## 2023-10-19 DIAGNOSIS — R0981 Nasal congestion: Secondary | ICD-10-CM | POA: Diagnosis present

## 2023-10-19 DIAGNOSIS — R059 Cough, unspecified: Secondary | ICD-10-CM | POA: Diagnosis present

## 2023-10-19 DIAGNOSIS — Z20828 Contact with and (suspected) exposure to other viral communicable diseases: Secondary | ICD-10-CM | POA: Insufficient documentation

## 2023-10-19 LAB — POC COVID19/FLU A&B COMBO
Covid Antigen, POC: NEGATIVE
Influenza A Antigen, POC: NEGATIVE
Influenza B Antigen, POC: NEGATIVE

## 2023-10-19 MED ORDER — PROMETHAZINE-DM 6.25-15 MG/5ML PO SYRP: 5.0000 mL | ORAL_SOLUTION | Freq: Every evening | ORAL | 0 refills | Status: AC | PRN

## 2023-10-19 MED ORDER — GUAIFENESIN ER 1200 MG PO TB12: 1200.0000 mg | ORAL_TABLET | Freq: Two times a day (BID) | ORAL | 0 refills | Status: AC

## 2023-10-19 NOTE — ED Triage Notes (Addendum)
 Pt c/o nbody aches, ear pain, cough, sob, congestion, and not feeling well for 3 days

## 2023-10-19 NOTE — ED Provider Notes (Addendum)
 [facility]    CSN: [id number] Arrival date & time: 10/19/23  1440      History   Chief Complaint Chief Complaint  Patient presents with   Nasal Congestion    HPI AMANEE IACOVELLI is a 48 y.o. female.   KYNDELL ZEISER is a 48 y.o. female presenting for chief complaint of cough, nasal congestion, body aches, chills, scratchy throat, and generalized fatigue that started 48-72 hours ago. Cough is dry, reports intermittent shortness of breath with coughing. Denies headache, N/V/D, abdominal pain, rash, dizziness, chest pain, leg swelling, and orthopnea. Her grandchildren were sick with similar symptoms recently and tested positive for flu A.  No other recent sick contacts with similar symptoms. Never smoker, denies history of asthma/chronic respiratory problems. Taking tylenol and OTC medicines without much relief.  BP elevated at 179/106 in triage. Denies CP, dizziness, headache, vision changes, extremity weakness, palpitations. History of HTN, she has taken her losartan and lasix today.   The history is provided by the patient. No language interpreter was used.    Past Medical History:  Diagnosis Date   Anemia    Hyperlipidemia    Hypertension     Patient Active Problem List   Diagnosis Date Noted   History of laparoscopic adjustable gastric banding, 04/08/2008 01/02/2014   Morbid obesity with BMI of 50.0-59.9, adult (HCC) 08/05/2013   Elevated cholesterol 08/02/2013   Hypertension 08/02/2013   Encounter for long-term (current) use of other medications 08/02/2013   Vitamin D deficiency 08/02/2013    Past Surgical History:  Procedure Laterality Date   BREAST SURGERY     LAPAROSCOPIC GASTRIC BANDING     oblation     TUBAL LIGATION      OB History   No obstetric history on file.      Home Medications    Prior to Admission medications   Medication Sig Start Date End Date Taking? Authorizing Provider  allopurinol (ZYLOPRIM) 100 MG tablet Take 2 tablets  by mouth daily. 09/26/23  Yes [provider]  furosemide (LASIX) 20 MG tablet Take 1 tablet by mouth daily. 09/26/23  Yes [provider]  Guaifenesin 1200 MG TB12 Take 1 tablet (1,200 mg total) by mouth in the morning and at bedtime. 10/19/23  Yes Carlisle Beers, FNP  indomethacin (INDOCIN SR) 75 MG CR capsule TAKE 1 CAPSULE BY MOUTH TWICE A DAY WITH MEALS AS NEEDED 09/26/23  Yes [provider]  losartan (COZAAR) 100 MG tablet Take by mouth. 09/27/23  Yes [provider]  promethazine-dextromethorphan (PROMETHAZINE-DM) 6.25-15 MG/5ML syrup Take 5 mLs by mouth at bedtime as needed for cough. 10/19/23  Yes Carlisle Beers, FNP  atorvastatin (LIPITOR) 20 MG tablet Take 1 tablet (20 mg total) by mouth daily at 6 PM. 10/23/15   Emi Belfast, FNP  benzonatate (TESSALON) 200 MG capsule Take 1 capsule (200 mg total) by mouth 3 (three) times daily as needed for cough. Patient not taking: Reported on 10/19/2023 01/25/23   Radford Pax, NP  hydrochlorothiazide (HYDRODIURIL) 25 MG tablet Take 1 tablet (25 mg total) by mouth daily. 10/23/15   Emi Belfast, FNP  ipratropium (ATROVENT) 0.03 % nasal spray Place 2 sprays into both nostrils every 12 (twelve) hours. 01/25/23   Radford Pax, NP  OZEMPIC, 2 MG/DOSE, 8 MG/3ML SOPN Inject 2 mg into the skin once a week.    [provider]  Vitamin D, Ergocalciferol, (DRISDOL) 50000 units CAPS capsule TAKE 1 CAPSULE  EVERY SEVEN DAYS 08/18/15   Emi Belfast, FNP    Family History Family History  Problem Relation Age of Onset   Hypertension Maternal Grandmother    Diabetes Maternal Grandmother    Diabetes Maternal Grandfather    Hypertension Maternal Grandfather     Social History Social History   Tobacco Use   Smoking status: Never  Substance Use Topics   Alcohol use: No    Comment: once in a blue moon   Drug use: No     Allergies   Patient has no known allergies.   Review of  Systems Review of Systems Per HPI  Physical Exam Triage Vital Signs ED Triage Vitals  Encounter Vitals Group     BP 10/19/23 1451 (!) 179/106     Systolic BP Percentile --      Diastolic BP Percentile --      Pulse Rate 10/19/23 1451 68     Resp 10/19/23 1451 17     Temp 10/19/23 1451 98.1 F (36.7 C)     Temp Source 10/19/23 1451 Oral     SpO2 10/19/23 1451 97 %     Weight --      Height --      Head Circumference --      Peak Flow --      Pain Score 10/19/23 1452 7     Pain Loc --      Pain Education --      Exclude from Growth Chart --    No data found.  Updated Vital Signs BP (!) 154/90 (BP Location: Right Arm)   Pulse 68   Temp 98.1 F (36.7 C) (Oral)   Resp 17   SpO2 97%   Visual Acuity Right Eye Distance:   Left Eye Distance:   Bilateral Distance:    Right Eye Near:   Left Eye Near:    Bilateral Near:     Physical Exam Vitals and nursing note reviewed.  Constitutional:      Appearance: She is ill-appearing. She is not toxic-appearing.  HENT:     Head: Normocephalic and atraumatic.     Right Ear: Hearing, tympanic membrane, ear canal and external ear normal.     Left Ear: Hearing, tympanic membrane, ear canal and external ear normal.     Nose: Congestion present.     Mouth/Throat:     Lips: Pink.     Mouth: Mucous membranes are moist. No injury or oral lesions.     Dentition: Normal dentition.     Tongue: No lesions.     Pharynx: Oropharynx is clear. Uvula midline. No pharyngeal swelling, oropharyngeal exudate, posterior oropharyngeal erythema, uvula swelling or postnasal drip.     Tonsils: No tonsillar exudate.  Eyes:     General: Lids are normal. Vision grossly intact. Gaze aligned appropriately.     Extraocular Movements: Extraocular movements intact.     Conjunctiva/sclera: Conjunctivae normal.  Neck:     Trachea: Trachea and phonation normal.  Cardiovascular:     Rate and Rhythm: Normal rate and regular rhythm.     Heart sounds: Normal  heart sounds, S1 normal and S2 normal.  Pulmonary:     Effort: Pulmonary effort is normal. No respiratory distress.     Breath sounds: Normal breath sounds and air entry. No wheezing, rhonchi or rales.  Chest:     Chest wall: No tenderness.  Musculoskeletal:     Cervical back: Neck supple.     Right lower  leg: No edema.     Left lower leg: No edema.  Lymphadenopathy:     Cervical: No cervical adenopathy.  Skin:    General: Skin is warm and dry.     Capillary Refill: Capillary refill takes less than 2 seconds.     Findings: No rash.  Neurological:     General: No focal deficit present.     Mental Status: She is alert and oriented to person, place, and time. Mental status is at baseline.     Cranial Nerves: No dysarthria or facial asymmetry.  Psychiatric:        Mood and Affect: Mood normal.        Speech: Speech normal.        Behavior: Behavior normal.        Thought Content: Thought content normal.        Judgment: Judgment normal.      UC Treatments / Results  Labs (all labs ordered are listed, but only abnormal results are displayed) Labs Reviewed  SARS CORONAVIRUS 2 (TAT 6-24 HRS)  POC COVID19/FLU A&B COMBO    EKG   Radiology No results found.  Procedures Procedures (including critical care time)  Medications Ordered in UC Medications - No data to display  Initial Impression / Assessment and Plan / UC Course  I have reviewed the triage vital signs and the nursing notes.  Pertinent labs & imaging results that were available during my care of the patient were reviewed by me and considered in my medical decision making (see chart for details).   1. Viral URI with cough Suspect viral URI, viral syndrome.  Strep/viral testing: POC COVID and flu testing negative, PCR COVID pending, staff will call if PCR test is positive and send in COVID antiviral at that time.   Physical exam findings reassuring, vital signs hemodynamically stable, and lungs clear, therefore  deferred imaging of the chest.  Advised supportive care/prescriptions for symptomatic relief as outlined in AVS.    2. Elevated BP reading in office with diagnosis of HTN BP elevated in clinic today likely secondary to viral illness. No red flag signs/symptoms indicating need for referral to ED due to elevated BP.  Discussed lifestyle and dietary changes to lower BP further. Advised to continue taking BP medication as prescribed and follow-up with PCP to discuss management of HTN further.  BP improved to 154/90 prior to discharge.   BP Readings from Last 3 Encounters:  10/19/23 (!) 154/90  01/25/23 (!) 173/133  09/11/14 140/80    Counseled patient on potential for adverse effects with medications prescribed/recommended today, strict ER and return-to-clinic precautions discussed, patient verbalized understanding.    Final Clinical Impressions(s) / UC Diagnoses   Final diagnoses:  Viral URI with cough  Elevated blood pressure reading in office with diagnosis of hypertension     Discharge Instructions      COVID testing is pending, staff will call you if this is positive. Wear a mask for 5 days of symptoms while you are in public, then you may remove your mask. You may go back to work if you do not have a fever for 24 hours without any medicines.   You have a viral illness which will improve on its own with rest, fluids, and medications to help with your symptoms.  Tylenol, guaifenesin (plain mucinex), and saline nasal sprays may help relieve symptoms.   Two teaspoons of honey in 1 cup of warm water every 4-6 hours may help with throat pains.  Promethazine DM cough syrup at bedtime as needed.  Humidifier in room at nighttime may help soothe cough (clean well daily).   For chest pain, shortness of breath, inability to keep food or fluids down without vomiting, fever that does not respond to tylenol or motrin, or any other severe symptoms, please go to the ER for further  evaluation. Return to urgent care as needed, otherwise follow-up with PCP.       ED Prescriptions     Medication Sig Dispense Auth. Provider   Guaifenesin 1200 MG TB12 Take 1 tablet (1,200 mg total) by mouth in the morning and at bedtime. 14 tablet Carlisle Beers, FNP   promethazine-dextromethorphan (PROMETHAZINE-DM) 6.25-15 MG/5ML syrup Take 5 mLs by mouth at bedtime as needed for cough. 118 mL Carlisle Beers, FNP      PDMP not reviewed this encounter.    Carlisle Beers, [place] 10/19/23 [phone]

## 2023-10-19 NOTE — Discharge Instructions (Addendum)
 COVID testing is pending, staff will call you if this is positive. Wear a mask for 5 days of symptoms while you are in public, then you may remove your mask. You may go back to work if you do not have a fever for 24 hours without any medicines.   You have a viral illness which will improve on its own with rest, fluids, and medications to help with your symptoms.  Tylenol, guaifenesin (plain mucinex), and saline nasal sprays may help relieve symptoms.   Two teaspoons of honey in 1 cup of warm water every 4-6 hours may help with throat pains. Promethazine DM cough syrup at bedtime as needed.  Humidifier in room at nighttime may help soothe cough (clean well daily).   For chest pain, shortness of breath, inability to keep food or fluids down without vomiting, fever that does not respond to tylenol or motrin, or any other severe symptoms, please go to the ER for further evaluation. Return to urgent care as needed, otherwise follow-up with PCP.

## 2023-10-20 LAB — SARS CORONAVIRUS 2 (TAT 6-24 HRS): SARS Coronavirus 2: NEGATIVE

## 2023-12-09 ENCOUNTER — Emergency Department (HOSPITAL_BASED_OUTPATIENT_CLINIC_OR_DEPARTMENT_OTHER)
Admission: EM | Admit: 2023-12-09 | Discharge: 2023-12-09 | Disposition: A | Discharge status: Home / Self Care | Attending: Emergency Medicine | Admitting: Emergency Medicine

## 2023-12-09 ENCOUNTER — Other Ambulatory Visit: Payer: Self-pay

## 2023-12-09 ENCOUNTER — Encounter (HOSPITAL_BASED_OUTPATIENT_CLINIC_OR_DEPARTMENT_OTHER): Payer: Self-pay | Admitting: Urology

## 2023-12-09 DIAGNOSIS — G500 Trigeminal neuralgia: Secondary | ICD-10-CM | POA: Diagnosis not present

## 2023-12-09 DIAGNOSIS — R410 Disorientation, unspecified: Secondary | ICD-10-CM | POA: Insufficient documentation

## 2023-12-09 DIAGNOSIS — R519 Headache, unspecified: Secondary | ICD-10-CM | POA: Diagnosis present

## 2023-12-09 MED ORDER — PREDNISONE 20 MG PO TABS
20.0000 mg | ORAL_TABLET | Freq: Every day | ORAL | 0 refills | Status: AC
Start: 2023-12-09 — End: 2023-12-14

## 2023-12-09 MED ORDER — PREDNISONE 20 MG PO TABS
20.0000 mg | ORAL_TABLET | Freq: Once | ORAL | Status: AC
  Administered 2023-12-09: 20 mg via ORAL
  Filled 2023-12-09: qty 1

## 2023-12-09 MED ORDER — CARBAMAZEPINE 200 MG PO TABS
ORAL_TABLET | ORAL | 0 refills | Status: DC
Start: 2023-12-09 — End: 2024-01-09

## 2023-12-09 NOTE — ED Provider Notes (Signed)
 [facility] Provider Note   CSN: [id number] Arrival date & time: 12/09/23  1306     History  Chief Complaint  Patient presents with   Facial Pain    [name] is a 48 y.o. female.  Patient is a 48 year old female presenting for electrical, sudden onset, severe pain starting on the right side of her face that is distributed to the forehead, cheek, and jaw.  Sensation is provoked with light touch.  She denies any motor dysfunction.  She denies sensation loss.  She denies any difficulty speaking, confusion, blurred vision, or any other associated symptoms.  She states the symptoms have been intermittent over the last several days.  No prior history of trigeminal neuralgia.  No new medications.  Admits to a history of migraines but none associated with the electrical shooting into the face.  She denies any rashes or blisters.  Denies any recent ear infections, tinnitus, or pain in the ear.  Denies any dental pain.  The history is provided by the patient. No language interpreter was used.       Home Medications Prior to Admission medications   Medication Sig Start Date End Date Taking? Authorizing Provider  allopurinol (ZYLOPRIM) 100 MG tablet Take 2 tablets by mouth daily. 09/26/23   [provider]  atorvastatin  (LIPITOR) 20 MG tablet Take 1 tablet (20 mg total) by mouth daily at 6 PM. 10/23/15   Steven Elam, FNP  benzonatate  (TESSALON ) 200 MG capsule Take 1 capsule (200 mg total) by mouth 3 (three) times daily as needed for cough. Patient not taking: Reported on 10/19/2023 01/25/23   Mayer, Jodi R, NP  carbamazepine  (TEGRETOL ) 200 MG tablet Take 1 tablet (200 mg total) by mouth in the morning and at bedtime for 3 days, THEN 2 tablets (400 mg total) in the morning and at bedtime for 4 days. 12/10/23 12/17/23  Teddi Favors, DO  furosemide (LASIX) 20 MG tablet Take 1 tablet by mouth daily. 09/26/23   [provider]   Guaifenesin  1200 MG TB12 Take 1 tablet (1,200 mg total) by mouth in the morning and at bedtime. 10/19/23   Starlene Eaton, FNP  hydrochlorothiazide  (HYDRODIURIL ) 25 MG tablet Take 1 tablet (25 mg total) by mouth daily. 10/23/15   Steven Elam, FNP  indomethacin (INDOCIN SR) 75 MG CR capsule TAKE 1 CAPSULE BY MOUTH TWICE A DAY WITH MEALS AS NEEDED 09/26/23   [provider]  ipratropium (ATROVENT ) 0.03 % nasal spray Place 2 sprays into both nostrils every 12 (twelve) hours. 01/25/23   Mayer, Jodi R, NP  losartan (COZAAR) 100 MG tablet Take by mouth. 09/27/23   [provider]  OZEMPIC, 2 MG/DOSE, 8 MG/3ML SOPN Inject 2 mg into the skin once a week.    [provider]  promethazine -dextromethorphan (PROMETHAZINE -DM) 6.25-15 MG/5ML syrup Take 5 mLs by mouth at bedtime as needed for cough. 10/19/23   Starlene Eaton, FNP  Vitamin D , Ergocalciferol , (DRISDOL ) 50000 units CAPS capsule TAKE 1 CAPSULE EVERY SEVEN DAYS 08/18/15   Steven Elam, FNP      Allergies    Patient has no known allergies.    Review of Systems   Review of Systems  Constitutional:  Negative for chills and fever.  HENT:  Negative for congestion, dental problem, ear discharge, ear pain, facial swelling, hearing loss, mouth sores, sinus pressure, sinus pain and sore throat.        Facial  pain   Eyes:  Negative for pain and visual disturbance.  Respiratory:  Negative for cough and shortness of breath.   Cardiovascular:  Negative for chest pain and palpitations.  Gastrointestinal:  Negative for abdominal pain and vomiting.  Genitourinary:  Negative for dysuria and hematuria.  Musculoskeletal:  Negative for arthralgias and back pain.  Skin:  Negative for color change and rash.  Neurological:  Negative for seizures and syncope.  All other systems reviewed and are negative.   Physical Exam Updated Vital Signs BP (!) 149/85 (BP Location: Left Arm)   Pulse 78   Temp 98.2 F (36.8 C)    Resp 18   Ht 5' (1.524 m)   Wt 128.4 kg   SpO2 100%   BMI 55.28 kg/m  Physical Exam Vitals and nursing note reviewed.  Constitutional:      Appearance: Normal appearance.  HENT:     Head: Normocephalic and atraumatic.     Right Ear: Hearing, tympanic membrane, ear canal and external ear normal.     Left Ear: Hearing, tympanic membrane, ear canal and external ear normal.     Nose: Nose normal.     Mouth/Throat:     Lips: Pink.     Mouth: Mucous membranes are moist.     Pharynx: Oropharynx is clear.  Eyes:     General: Lids are normal. Vision grossly intact.     Pupils: Pupils are equal, round, and reactive to light.  Neurological:     Mental Status: She is alert. She is disoriented.     GCS: GCS eye subscore is 4. GCS verbal subscore is 5. GCS motor subscore is 6.     Cranial Nerves: Cranial nerves 2-12 are intact.     Sensory: Sensation is intact.     Motor: Motor function is intact.     ED Results / Procedures / Treatments   Labs (all labs ordered are listed, but only abnormal results are displayed) Labs Reviewed - No data to display  EKG None  Radiology No results found.  Procedures Procedures    Medications Ordered in ED Medications  predniSONE  (DELTASONE ) tablet 20 mg (20 mg Oral Given 12/09/23 1557)    ED Course/ Medical Decision Making/ A&P                                 Medical Decision Making Risk Prescription drug management.   48 year old female presenting for electrical, sudden onset, severe pain starting on the right side of her face that is distributed to the forehead, cheek, and jaw.  Patient is allergic x 3, no acute distress, afebrile, stable vital signs.  Concerns for trigeminal neuralgia due to sensitivity on exam.  No neurovascular deficits.  Prescription sent to pharmacy.  Recommended for follow-up with neurology and outpatient MRI to rule out primary causes of trigeminal neuralgia.  Patient in no distress and overall condition  improved here in the ED. Detailed discussions were had with the patient regarding current findings, and need for close f/u with PCP or on call doctor. The patient has been instructed to return immediately if the symptoms worsen in any way for re-evaluation. Patient verbalized understanding and is in agreement with current care plan. All questions answered prior to discharge.         Final Clinical Impression(s) / ED Diagnoses Final diagnoses:  Trigeminal neuralgia-right side of face    Rx / DC Orders ED Discharge  Orders          Ordered    carbamazepine  (TEGRETOL ) 200 MG tablet  BID,   Status:  Discontinued        12/09/23 1544    predniSONE  (DELTASONE ) 20 MG tablet  Daily        12/09/23 1544              Quinn Bucco, DO 12/17/23 2326

## 2023-12-09 NOTE — ED Triage Notes (Signed)
 Pt states sharp pain to right side of face  States "feels electrifying", noticed it on Sunday and has intensified since Pain intermittent, feels like shock when it is touched  Denies any facial swelling or dental pain  No pain at this time

## 2023-12-09 NOTE — Discharge Instructions (Addendum)
 When you are severity of symptoms we have decided to start you on carbamazepine.  This medication can be taken at a dose of 200 mg twice a day for 7 to 14 days.  Medication can be increased by 200 mg every 7 to 14 days until pain is controlled with a maximum dose of 800 mg twice a day.  We move slowly when increasing this medication to allow the body to get used to the medication to decrease the risk of side effects.  In addition to carbamazepine started on a 5-day course of oral steroids can help with nerve related pain.  Please call to establish care with neurologist for diagnosis of trigeminal neuralgia.  This specialist will help continue your carbamazepine prescription.  If you do not have pain relief, you can try taking another nerve pain medication called gabapentin.  The specialist will order an MRI for you if symptoms do not improve to rule out other etiologies of Trigeminal Neuralgia. They can also do a peripheral nerve block if pain does not improve with medication alone.

## 2023-12-10 ENCOUNTER — Telehealth (HOSPITAL_BASED_OUTPATIENT_CLINIC_OR_DEPARTMENT_OTHER): Payer: Self-pay | Admitting: Emergency Medicine

## 2023-12-10 MED ORDER — CARBAMAZEPINE 200 MG PO TABS: ORAL_TABLET | ORAL | 0 refills | Status: DC

## 2023-12-10 MED ORDER — CARBAMAZEPINE 200 MG PO TABS: ORAL_TABLET | ORAL | 0 refills | Status: AC

## 2023-12-10 NOTE — Telephone Encounter (Signed)
 Rx updatd

## 2023-12-10 NOTE — Telephone Encounter (Signed)
Pt wants medication sent to different pharmacy.

## 2023-12-10 NOTE — Telephone Encounter (Signed)
 Rx updated.

## 2024-01-11 ENCOUNTER — Telehealth: Payer: Self-pay | Admitting: Neurology

## 2024-01-11 NOTE — Telephone Encounter (Signed)
 Request to cx . No longer needed

## 2024-02-27 ENCOUNTER — Ambulatory Visit: Admitting: Neurology
